# Patient Record
Sex: Male | Born: 1935 | Race: Black or African American | Hispanic: No | Marital: Married | Smoking: Former smoker
Health system: Southern US, Community
[De-identification: ages and names within clinical notes are randomized; demographics above are authoritative.]

## PROBLEM LIST (undated history)

## (undated) DIAGNOSIS — I4891 Unspecified atrial fibrillation: Principal | ICD-10-CM

## (undated) DIAGNOSIS — E876 Hypokalemia: Secondary | ICD-10-CM

## (undated) DIAGNOSIS — C320 Malignant neoplasm of glottis: Secondary | ICD-10-CM

## (undated) DIAGNOSIS — Z923 Personal history of irradiation: Secondary | ICD-10-CM

## (undated) DIAGNOSIS — I10 Essential (primary) hypertension: Secondary | ICD-10-CM

## (undated) DIAGNOSIS — I4510 Unspecified right bundle-branch block: Secondary | ICD-10-CM

## (undated) HISTORY — DX: Unspecified atrial fibrillation: I48.91

## (undated) HISTORY — DX: Unspecified right bundle-branch block: I45.10

## (undated) HISTORY — PX: HERNIA REPAIR: SHX51

---

## 2003-10-09 ENCOUNTER — Emergency Department (HOSPITAL_COMMUNITY): Admission: EM | Admit: 2003-10-09 | Discharge: 2003-10-09 | Payer: Self-pay | Admitting: Emergency Medicine

## 2012-06-20 ENCOUNTER — Other Ambulatory Visit: Payer: Self-pay | Admitting: Family Medicine

## 2012-06-20 DIAGNOSIS — Z136 Encounter for screening for cardiovascular disorders: Secondary | ICD-10-CM

## 2012-07-04 ENCOUNTER — Ambulatory Visit
Admission: RE | Admit: 2012-07-04 | Discharge: 2012-07-04 | Disposition: A | Discharge status: Home / Self Care | Payer: Medicare Other | Source: Ambulatory Visit | Attending: Family Medicine | Admitting: Family Medicine

## 2012-07-04 DIAGNOSIS — Z136 Encounter for screening for cardiovascular disorders: Secondary | ICD-10-CM

## 2013-06-07 ENCOUNTER — Encounter (HOSPITAL_BASED_OUTPATIENT_CLINIC_OR_DEPARTMENT_OTHER): Admission: RE | Payer: Self-pay | Source: Ambulatory Visit

## 2013-06-07 ENCOUNTER — Ambulatory Visit (HOSPITAL_BASED_OUTPATIENT_CLINIC_OR_DEPARTMENT_OTHER): Admission: RE | Admit: 2013-06-07 | Payer: Medicare Other | Source: Ambulatory Visit | Admitting: Otolaryngology

## 2013-06-07 SURGERY — MICROLARYNGOSCOPY
Anesthesia: General

## 2013-08-22 DIAGNOSIS — C320 Malignant neoplasm of glottis: Secondary | ICD-10-CM

## 2013-08-22 HISTORY — DX: Malignant neoplasm of glottis: C32.0

## 2013-08-28 ENCOUNTER — Other Ambulatory Visit: Payer: Self-pay | Admitting: Otolaryngology

## 2013-08-28 DIAGNOSIS — C801 Malignant (primary) neoplasm, unspecified: Secondary | ICD-10-CM

## 2013-09-02 ENCOUNTER — Ambulatory Visit
Admission: RE | Admit: 2013-09-02 | Discharge: 2013-09-02 | Disposition: A | Discharge status: Home / Self Care | Payer: Medicare Other | Source: Ambulatory Visit | Attending: Otolaryngology | Admitting: Otolaryngology

## 2013-09-02 DIAGNOSIS — C801 Malignant (primary) neoplasm, unspecified: Secondary | ICD-10-CM

## 2013-09-02 MED ORDER — IOHEXOL 300 MG/ML  SOLN
75.0000 mL | Freq: Once | INTRAMUSCULAR | Status: AC | PRN
  Administered 2013-09-02: 75 mL via INTRAVENOUS

## 2013-09-02 NOTE — Progress Notes (Signed)
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 male who  presented with chronic hoarseness, in August 2014, the day he swallowed a pill "the wrong way." He initially saw Dr. Janace Hoard, who recommended a biopsy, but the patient wanted a second opinion. A couple months later he saw Dr. Lucia Gaskins. Laryngoscopy by Dr. Lucia Gaskins revealed an abnormal growth on the left true vocal CORD with slight diminished mobility on the left vocal cord compared to the right. The right vocal cord was clear. The abnormalities were limited to the glottis. On neck exam he had a smooth nodule overlying the anterior laryngeal cartilage. Clinically Dr. 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 It is mobile and smooth.  Biopsies of Left Vocal cord byDr. Leonides Sake. Newman revealed: 08/22/13, = Squamous Cell Carcinoma with Focal Necrosis.  CT scan of the neck performed on 09/02/2013 was discussed today at tumor board. 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 He has small scattered lymph nodes in the neck, some of which are rounded, but they are not highly suspicious for metastatic disease.  Denies dysphagia.   denies weight loss. Denies pain. Stopped smoking 15-20 yrs ago.Stopped chewing tobacco about 6 months ago. 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    PAST SURGICAL HISTORY:History reviewed. No pertinent past surgical  history.  FAMILY HISTORY: family history includes Cancer in his brother and mother.  SOCIAL HISTORY:  reports that he quit smoking about 14 years ago. He quit smokeless tobacco use today. His smokeless tobacco use included Chew. He reports that he drinks alcohol.  ALLERGIES: Review of patient's allergies indicates no known allergies.  MEDICATIONS:  Current Outpatient Prescriptions  Medication Sig Dispense Refill  . guaiFENesin (MUCINEX) 600 MG 12 hr tablet Take 600 mg by mouth as needed.      . Pseudoephedrine HCl (SINUS & ALLERGY 12 HOUR PO) Take 1 tablet by mouth as needed.      . tamsulosin (FLOMAX) 0.4 MG CAPS capsule Take 0.4 mg by mouth daily after breakfast.      . telmisartan-hydrochlorothiazide (MICARDIS HCT) 80-25 MG per tablet Take 1 tablet by mouth daily.       No current facility-administered medications for this encounter.    REVIEW OF SYSTEMS:  Notable for that above.   PHYSICAL EXAM:  vitals were not taken for this visit. weight 221 pounds. Height 5 foot 11 inches. Blood pressure 147/88. Pulse 96. Respiratory rate 20. Temperature 98.1 degrees. Pulse ox 99%  General: Alert and oriented, in no acute distress. He is very hoarse HEENT: Head is normocephalic. Poor dentition. Upper dentures. Moist mucous membranes in his mouth.. Oropharynx is clear. Neck: Mobile, soft growth anterior to laryngeal cartilage consistent with cyst, no palpable cervical or supraclavicular lymphadenopathy. Heart: Regular in rate and rhythm with no murmurs, rubs, or gallops. Chest: Clear to auscultation bilaterally, with no rhonchi, wheezes, or rales. Abdomen: Soft, nontender, nondistended, with no rigidity or  guarding. Extremities: No cyanosis or edema. Lymphatics: No concerning lymphadenopathy. Skin: No concerning lesions. Musculoskeletal: symmetric strength and muscle tone throughout. Neurologic: Cranial nerves II through XII are grossly intact. No obvious focalities. Speech is fluent.  Coordination is intact. Psychiatric: Judgment and insight are intact. Affect is appropriate.  Laryngoscopic exam: Anesthetic was administered in the patient's nares. Exam revealed a small deficit in the anterior left true vocal cord. This is presumably where the biopsy was performed. No involvement of the anterior commissure or contralateral vocal cord. Currently no obvious impaired mobility on exam of the vocal cords.  ECOG =0  0 - Asymptomatic (Fully active, able to carry on all predisease activities without restriction)  1 - Symptomatic but completely ambulatory (Restricted in physically strenuous activity but ambulatory and able to carry out work of a light or sedentary nature. For example, light housework, office work)  2 - Symptomatic, <50% in bed during the day (Ambulatory and capable of all self care but unable to carry out any work activities. Up and about more than 50% of waking hours)  3 - Symptomatic, >50% in bed, but not bedbound (Capable of only limited self-care, confined to bed or chair 50% or more of waking hours)  4 - Bedbound (Completely disabled. Cannot carry on any self-care. Totally confined to bed or chair)  5 - Death   Eustace Pen MM, Creech RH, Tormey DC, et al. [phone]). "Toxicity and response criteria of the [facility]". [place] Oncol. 5 (6): 649-55   LABORATORY DATA:  No results found for this basename: WBC,  HGB,  HCT,  MCV,  PLT   CMP  No results found for this basename: na,  k,  cl,  co2,  glucose,  bun,  creatinine,  calcium,  prot,  albumin,  ast,  alt,  alkphos,  bilitot,  gfrnonaa,  gfraa        RADIOGRAPHY:CT neck on 09-02-13 showed 1. Asymmetry of the left vocal cord concerning for tumor. There is  no significant infraglottic extension.  2. Question extension through thyroid cartilage with a anterior soft  tissue nodule measuring 2.1 x 2.7 x 1.4 cm. This could represent a  thyroglossal duct cyst.  3. Rounded left level 3 lymph  nodes are not pathologically enlarged  but do raise some concern for metastatic disease.  4. Multiple metallic BBs throughout the left neck.  5. Pleural calcifications compatible with prior asbestos exposure.  There is no evidence for asbestosis.  IMPRESSION/PLAN: This patient has what appears to be clinical T2N0 glottic cancer (based on some compromise of the mobility of the left vocal cord on otolaryngology's laryngoscopic exam before biopsy)  For his early stage glottic cancer, I recommend external beam radiotherapy to a dose of 65.25 Gray in 29 fractions at 2.25 Pearline Cables per fraction. I would treat with opposed lateral beams, focusing on the larynx with margin.  We discussed the risks benefits and side effects of laryngeal radiotherapy which include but are not necessarily limited to fatigue, skin irritation and esophagitis in the acute setting. He understands that late effects may include rare damage to the internal tissues including the cartilage, larynx, and esophagus. We discussed risk of hypothyroidism. He is enthusiastic about proceeding. A consent form has been signed and placed in his chart. We will simulate him on February 6, anticipating starting treatment about a week thereafter.  As described above, based on discussion with radiology, there is a very low suspicion of any positive lymph nodes in his  neck and no further workup will be necessary for staging. However, Dr. Lucia Gaskins will aspirate the presumed thyro glossal duct cyst in his office later this morning to confirm that it is benign.  I spent 45 minutes  face to face with the patient and more than 50% of that time was spent in counseling and/or coordination of care.    __________________________________________   Eppie Gibson, MD

## 2013-09-03 ENCOUNTER — Telehealth: Payer: Self-pay | Admitting: Family Medicine

## 2013-09-03 ENCOUNTER — Encounter: Payer: Self-pay | Admitting: Radiation Oncology

## 2013-09-03 NOTE — Telephone Encounter (Signed)
Called pt at home to introduce myself as the nurse navigator that works with [name].  I indicated that I would be joining him tomorrow during his appt and would tell him more about my role as a member of his Care Team when we meet.  He confirmed he knows the location of [place].  I encouraged him to arrive at 8:15 for his 8:30 Nurse Eval.  He indicated understanding.  Initiating navigation as L1 (new) patient with this encounter.  Gayleen Orem, RN, BSN, [facility] Head & Neck Oncology Navigator [phone]

## 2013-09-03 NOTE — Progress Notes (Signed)
Head and Neck Cancer Location of Tumor / Histology: Squamous Cell carcinoma of the Left Vocal Cord    Patient presented with chronic hoarseness, in August 014,  the day  he swallowed a pill "the wrong way."  He was seen by an ENT a "few months" prior to being see by Dr. Janace Hoard, but wanted a second opinion.  He has a a "scratchy voice".  Biopsies of Left Vocal cord (if applicable) revealed: 3/71/06,  = Squamous Cell Carcinoma with Focal Necrosis, Dr. Leonides Sake. Newman  Nutrition Status:  Weight changes:   Swallowing status:   Plans, if any, for PEG tube:   Tobacco/Marijuana/Snuff/ETOH use: Stopped smoking 15-20 years ago  Past/Anticipated interventions by otolaryngology, if any:   Past/Anticipated interventions by medical oncology, if any:   Referrals yet, to any of the following?  Social Work?   Dentistry?   Swallowing therapy?   Nutrition?   Med/Onc?   PEG placement?   SAFETY ISSUES:  Prior radiation?   Pacemaker/ICD?   Is the patient on methotrexate?   Current Complaints / other details:

## 2013-09-04 ENCOUNTER — Ambulatory Visit
Admission: RE | Admit: 2013-09-04 | Discharge: 2013-09-04 | Disposition: A | Discharge status: Home / Self Care | Payer: Medicare Other | Source: Ambulatory Visit | Attending: Radiation Oncology | Admitting: Radiation Oncology

## 2013-09-04 ENCOUNTER — Encounter: Payer: Self-pay | Admitting: Radiation Oncology

## 2013-09-04 ENCOUNTER — Encounter: Payer: Self-pay | Admitting: *Deleted

## 2013-09-04 ENCOUNTER — Other Ambulatory Visit (HOSPITAL_COMMUNITY)
Admission: RE | Admit: 2013-09-04 | Discharge: 2013-09-04 | Disposition: A | Discharge status: Home / Self Care | Payer: Medicare Other | Source: Ambulatory Visit | Attending: Otolaryngology | Admitting: Otolaryngology

## 2013-09-04 VITALS — BP 147/88 | HR 96 | Temp 98.1°F | Resp 20 | Ht 71.0 in | Wt 221.1 lb

## 2013-09-04 DIAGNOSIS — R221 Localized swelling, mass and lump, neck: Principal | ICD-10-CM

## 2013-09-04 DIAGNOSIS — Z87891 Personal history of nicotine dependence: Secondary | ICD-10-CM | POA: Insufficient documentation

## 2013-09-04 DIAGNOSIS — C320 Malignant neoplasm of glottis: Secondary | ICD-10-CM | POA: Insufficient documentation

## 2013-09-04 DIAGNOSIS — Z79899 Other long term (current) drug therapy: Secondary | ICD-10-CM | POA: Insufficient documentation

## 2013-09-04 DIAGNOSIS — R220 Localized swelling, mass and lump, head: Secondary | ICD-10-CM | POA: Insufficient documentation

## 2013-09-04 DIAGNOSIS — I10 Essential (primary) hypertension: Secondary | ICD-10-CM | POA: Insufficient documentation

## 2013-09-04 DIAGNOSIS — C801 Malignant (primary) neoplasm, unspecified: Secondary | ICD-10-CM | POA: Insufficient documentation

## 2013-09-04 DIAGNOSIS — R091 Pleurisy: Secondary | ICD-10-CM | POA: Insufficient documentation

## 2013-09-04 DIAGNOSIS — R490 Dysphonia: Secondary | ICD-10-CM | POA: Insufficient documentation

## 2013-09-04 HISTORY — DX: Essential (primary) hypertension: I10

## 2013-09-04 HISTORY — DX: Malignant neoplasm of glottis: C32.0

## 2013-09-04 MED ORDER — LARYNGOSCOPY SOLUTION RAD-ONC
15.0000 mL | Freq: Once | TOPICAL | Status: AC
  Administered 2013-09-04: 15 mL via TOPICAL
  Filled 2013-09-04: qty 15

## 2013-09-04 NOTE — Progress Notes (Signed)
Please see the Nurse Progress Note in the MD Initial Consult Encounter for this patient. 

## 2013-09-04 NOTE — Progress Notes (Signed)
  Radiation Oncology         [phone] ________________________________  Name: Derrick Ayala MRN: [id number]  Date: 09/06/2013  DOB: [birth year]  SIMULATION AND TREATMENT PLANNING NOTE  outpatient  DIAGNOSIS:  Glottic Cancer, Stage T2N0M0  NARRATIVE:  The patient was brought to the [place] suite.  Identity was confirmed.  All relevant records and images related to the planned course of therapy were reviewed.  The patient freely provided informed written consent to proceed with treatment after reviewing the details related to the planned course of therapy. The consent form was witnessed and verified by the simulation staff.    Then, the patient was set-up in a stable reproducible  prone position for radiation therapy.  CT images were obtained.  Surface markings were placed.  The CT images were loaded into the planning software.    TREATMENT PLANNING NOTE: Treatment planning then occurred.  The radiation prescription was entered and confirmed.    A total of 3 medically necessary complex treatment devices were fabricated and supervised by me - aquaplast mask and 2 wedges for the opposed lateral fields.. I have requested : Isodose Plan.   The patient will receive 65.25 Gy in 29 fractions.   -----------------------------------  Eppie Gibson, MD

## 2013-09-04 NOTE — Progress Notes (Signed)
Met with patient during initial consult with [name].  Further introduced myself as his Navigator, explained my role as a member of his Care Team, showed him standard mask, encouraged him to call me with questions and concerns as he begins procedures and treatments.  He verbalized understanding. Showed him SIM and LINAC 1/2, explained arrival procedure when he begins treatments.  He verbalized understanding.  Initiating navigation as L1 patient (new) with this encounter.  Gayleen Orem, RN, BSN, [facility] Head & Neck Oncology Navigator [phone]

## 2013-09-04 NOTE — Progress Notes (Addendum)
Editor: Rebecca Eaton, RN (Registered Nurse)      Head and Neck Cancer Location of Tumor / Histology: Squamous Cell carcinoma of the Left Vocal Cord   Patient presented with chronic hoarseness, in August 014, the day he swallowed a pill "the wrong way." He was seen by an ENT a "few months" prior to being see by Dr. Janace Hoard, but wanted a second opinion. He has a a "scratchy voice".  Biopsies of Left Vocal cord (if applicable) revealed: 3/50/09, = Squamous Cell Carcinoma with Focal Necrosis, Dr. Leonides Sake. Newman   Nutrition Status:  Weight changes: says has gained some weight back recently  Swallowing status:  No difficulty  Plans, if any, for PEG tube:  Not as yet  Tobacco/Marijuana/Snuff/ETOH use: Stopped smoking 15-20 years ago   Past/Anticipated interventions by otolaryngology, if any:  Dr. Melony Overly   Past/Anticipated interventions by medical oncology, if any: No  Referrals yet, to any of the following?  Social Work? NO  Dentistry? No Swallowing therapy? No Nutrition? No Med/Onc? No PEG placement?  SAFETY ISSUES:  Prior radiation? No Pacemaker/ICD? No Is the patient on methotrexate? No  Current Complaints / other details: Married, 1 daughter , no c/o pain, or discomfort just hoarseness, lHx left inguinal hernia repair, Mothr deceased in her 38's unknown cancer, 1 brother deceased in his 6-0's of lung cancer

## 2013-09-06 ENCOUNTER — Ambulatory Visit
Admission: RE | Admit: 2013-09-06 | Discharge: 2013-09-06 | Disposition: A | Discharge status: Home / Self Care | Payer: Medicare Other | Source: Ambulatory Visit | Attending: Radiation Oncology | Admitting: Radiation Oncology

## 2013-09-06 ENCOUNTER — Encounter: Payer: Self-pay | Admitting: *Deleted

## 2013-09-06 DIAGNOSIS — Y842 Radiological procedure and radiotherapy as the cause of abnormal reaction of the patient, or of later complication, without mention of misadventure at the time of the procedure: Secondary | ICD-10-CM | POA: Insufficient documentation

## 2013-09-06 DIAGNOSIS — R1310 Dysphagia, unspecified: Secondary | ICD-10-CM | POA: Insufficient documentation

## 2013-09-06 DIAGNOSIS — C320 Malignant neoplasm of glottis: Secondary | ICD-10-CM | POA: Insufficient documentation

## 2013-09-06 DIAGNOSIS — L988 Other specified disorders of the skin and subcutaneous tissue: Secondary | ICD-10-CM | POA: Insufficient documentation

## 2013-09-06 DIAGNOSIS — Z510 Encounter for antineoplastic radiation therapy: Secondary | ICD-10-CM | POA: Insufficient documentation

## 2013-09-06 DIAGNOSIS — E876 Hypokalemia: Secondary | ICD-10-CM | POA: Insufficient documentation

## 2013-09-06 NOTE — Progress Notes (Signed)
Met with patient during his SIM to provide support and encouragement.  Following procedure, re-familiarized him with LINAC 1 area where he will be treated, introduced him to RTTs, re-explained process for check-in and arriving to RT for tmt.  He verbalized understanding.  Continuing to navigate as L1 (new) patient.  Gayleen Orem, RN, BSN, [facility] Head & Neck Oncology Navigator [phone]

## 2013-09-13 ENCOUNTER — Ambulatory Visit
Admission: RE | Admit: 2013-09-13 | Discharge: 2013-09-13 | Disposition: A | Discharge status: Home / Self Care | Payer: Medicare Other | Source: Ambulatory Visit | Attending: Radiation Oncology | Admitting: Radiation Oncology

## 2013-09-13 ENCOUNTER — Telehealth: Payer: Self-pay | Admitting: *Deleted

## 2013-09-13 ENCOUNTER — Inpatient Hospital Stay: Admission: RE | Admit: 2013-09-13 | Payer: Medicare Other | Source: Ambulatory Visit | Admitting: Radiation Oncology

## 2013-09-13 DIAGNOSIS — C320 Malignant neoplasm of glottis: Secondary | ICD-10-CM

## 2013-09-13 NOTE — Telephone Encounter (Signed)
Called patient re: today's 5:30 Port appt.  Offered him clarification that today was for another scan, his actual 1st tmt will be Monday.  He verbalized understanding.  He stated that he did not know results of FNA he had by Dr. Lucia Gaskins recently, expressed anticipation that [name] could provide information today.  I told him I would make [name] aware.  Continuing navigation as L1 patient (new patient).  Gayleen Orem, RN, BSN, [facility] Head & Neck Oncology Navigator [phone]

## 2013-09-13 NOTE — Progress Notes (Signed)
Simulation Verification Note Outpatient  The patient was brought to the treatment unit and placed in the planned treatment position. The clinical setup was verified. Then port films were obtained and uploaded to the radiation oncology medical record software.  The treatment beams were carefully compared against the planned radiation fields. The position location and shape of the radiation fields was reviewed. They targeted volume of tissue appears to be appropriately covered by the radiation beams. Organs at risk appear to be excluded as planned.  Based on my personal review, I approved the simulation verification. The patient's treatment will proceed as planned.  -----------------------------------  Sherron Mapp, MD   

## 2013-09-16 ENCOUNTER — Ambulatory Visit
Admission: RE | Admit: 2013-09-16 | Discharge: 2013-09-16 | Disposition: A | Discharge status: Home / Self Care | Payer: Medicare Other | Source: Ambulatory Visit | Attending: Radiation Oncology | Admitting: Radiation Oncology

## 2013-09-16 ENCOUNTER — Encounter: Payer: Self-pay | Admitting: Radiation Oncology

## 2013-09-16 VITALS — BP 139/80 | HR 91 | Temp 98.7°F | Resp 20 | Wt 225.0 lb

## 2013-09-16 DIAGNOSIS — C320 Malignant neoplasm of glottis: Secondary | ICD-10-CM

## 2013-09-16 MED ORDER — BIAFINE EX EMUL
CUTANEOUS | Status: DC | PRN
  Administered 2013-09-16: 09:00:00 via TOPICAL

## 2013-09-16 NOTE — Progress Notes (Signed)
Patient education don, rad book, biafine given, discusses pain, fatigue,skin irritation,throat changes, diet, loss tase, earaches, increas protein high calories, see MD weekly and prn, gave Charna Busman, RN business card to pateint, tach back given, hoarseness still, no c/o  9:09 AM

## 2013-09-16 NOTE — Progress Notes (Signed)
   Weekly Management Note:  outpatient Current Dose:  2.25 Gy  Projected Dose: 65.25 Gy   Narrative:  The patient presents for routine under treatment assessment.  CBCT/MVCT images/Port film x-rays were reviewed.  The chart was checked. Doing well.  Physical Findings:  weight is 225 lb (102.059 kg). His oral temperature is 98.7 F (37.1 C). His blood pressure is 139/80 and his pulse is 91. His respiration is 20 and oxygen saturation is 100%.  NAD  Impression:  The patient is tolerating radiotherapy.  Plan:  Continue radiotherapy as planned. I informed him of the benign results from his neck aspiration.  ________________________________   Eppie Gibson, M.D.

## 2013-09-17 ENCOUNTER — Ambulatory Visit
Admission: RE | Admit: 2013-09-17 | Discharge: 2013-09-17 | Disposition: A | Discharge status: Home / Self Care | Payer: Medicare Other | Source: Ambulatory Visit | Attending: Radiation Oncology | Admitting: Radiation Oncology

## 2013-09-18 ENCOUNTER — Ambulatory Visit
Admission: RE | Admit: 2013-09-18 | Discharge: 2013-09-18 | Disposition: A | Discharge status: Home / Self Care | Payer: Medicare Other | Source: Ambulatory Visit | Attending: Radiation Oncology | Admitting: Radiation Oncology

## 2013-09-18 ENCOUNTER — Encounter: Payer: Self-pay | Admitting: *Deleted

## 2013-09-18 NOTE — [facility] Oncology Navigator Note (Signed)
Met with patient prior to scheduled RT.  He stated that tmts are going well, did not express any concerns or needs.  I reminded him to call if that changes; he verbalized understanding.  Continuing navigation as L1 patient (new patient).  Gayleen Orem, RN, BSN, [facility] Head & Neck Oncology Navigator [phone]

## 2013-09-19 ENCOUNTER — Ambulatory Visit
Admission: RE | Admit: 2013-09-19 | Discharge: 2013-09-19 | Disposition: A | Discharge status: Home / Self Care | Payer: Medicare Other | Source: Ambulatory Visit | Attending: Radiation Oncology | Admitting: Radiation Oncology

## 2013-09-20 ENCOUNTER — Ambulatory Visit
Admission: RE | Admit: 2013-09-20 | Discharge: 2013-09-20 | Disposition: A | Discharge status: Home / Self Care | Payer: Medicare Other | Source: Ambulatory Visit | Attending: Radiation Oncology | Admitting: Radiation Oncology

## 2013-09-23 ENCOUNTER — Encounter: Payer: Self-pay | Admitting: *Deleted

## 2013-09-23 ENCOUNTER — Ambulatory Visit
Admission: RE | Admit: 2013-09-23 | Discharge: 2013-09-23 | Disposition: A | Discharge status: Home / Self Care | Payer: Medicare Other | Source: Ambulatory Visit | Attending: Radiation Oncology | Admitting: Radiation Oncology

## 2013-09-23 ENCOUNTER — Encounter: Payer: Self-pay | Admitting: Radiation Oncology

## 2013-09-23 VITALS — BP 152/78 | HR 93 | Temp 98.7°F | Wt 224.1 lb

## 2013-09-23 DIAGNOSIS — C320 Malignant neoplasm of glottis: Secondary | ICD-10-CM

## 2013-09-23 MED ORDER — MAGIC MOUTHWASH W/LIDOCAINE: ORAL | Status: DC

## 2013-09-23 NOTE — Progress Notes (Signed)
   Weekly Management Note:  outpatient Current Dose:  13.5 Gy  Projected Dose: 65.25 Gy   Narrative:  The patient presents for routine under treatment assessment.  CBCT/MVCT images/Port film x-rays were reviewed.  The chart was checked. No complaints  Physical Findings:  weight is 224 lb 1.6 oz (101.651 kg). His temperature is 98.7 F (37.1 C). His blood pressure is 152/78 and his pulse is 93. His oxygen saturation is 97%.  No skin irritation on neck  Impression:  The patient is tolerating radiotherapy.  Plan:  Continue radiotherapy as planned. Rx for MMW given in case odynophagia develops this week.  ________________________________   Eppie Gibson, M.D.

## 2013-09-23 NOTE — Progress Notes (Signed)
Derrick Ayala has received 6 fractions to the larynx.  He is hoarse voice and denies any sore throat or difficulty swallowing.

## 2013-09-24 ENCOUNTER — Ambulatory Visit
Admission: RE | Admit: 2013-09-24 | Discharge: 2013-09-24 | Disposition: A | Discharge status: Home / Self Care | Payer: Medicare Other | Source: Ambulatory Visit | Attending: Radiation Oncology | Admitting: Radiation Oncology

## 2013-09-25 ENCOUNTER — Ambulatory Visit
Admission: RE | Admit: 2013-09-25 | Discharge: 2013-09-25 | Disposition: A | Discharge status: Home / Self Care | Payer: Medicare Other | Source: Ambulatory Visit | Attending: Radiation Oncology | Admitting: Radiation Oncology

## 2013-09-26 ENCOUNTER — Ambulatory Visit
Admission: RE | Admit: 2013-09-26 | Discharge: 2013-09-26 | Disposition: A | Discharge status: Home / Self Care | Payer: Medicare Other | Source: Ambulatory Visit | Attending: Radiation Oncology | Admitting: Radiation Oncology

## 2013-09-27 ENCOUNTER — Ambulatory Visit
Admission: RE | Admit: 2013-09-27 | Discharge: 2013-09-27 | Disposition: A | Discharge status: Home / Self Care | Payer: Medicare Other | Source: Ambulatory Visit | Attending: Radiation Oncology | Admitting: Radiation Oncology

## 2013-09-27 ENCOUNTER — Encounter: Payer: Self-pay | Admitting: *Deleted

## 2013-09-27 NOTE — Progress Notes (Signed)
Met with patient prior to his scheduled RT.  He stated that he thinks his voice is improving somewhat; is beginning to experience throat discomfort with swallowing.  He reports that he is able to continue eating and drinking per normal; has not needed MMW w/ Lidocaine yet, still has Rx that and will have it filled when this changes.  He stated he continues to apply Biafine BID with the first application after his morning RT.  He denied any concerns/needs.  Continuing navigation as L1 patient (new patient).  Gayleen Orem, RN, BSN, [facility] Head & Neck Oncology Navigator [phone]

## 2013-09-30 ENCOUNTER — Ambulatory Visit
Admission: RE | Admit: 2013-09-30 | Discharge: 2013-09-30 | Disposition: A | Discharge status: Home / Self Care | Payer: Medicare Other | Source: Ambulatory Visit | Attending: Radiation Oncology | Admitting: Radiation Oncology

## 2013-09-30 ENCOUNTER — Telehealth: Payer: Self-pay | Admitting: *Deleted

## 2013-09-30 ENCOUNTER — Inpatient Hospital Stay
Admission: RE | Admit: 2013-09-30 | Discharge: 2013-09-30 | Disposition: A | Discharge status: Home / Self Care | Payer: Medicare Other | Source: Ambulatory Visit | Attending: Radiation Oncology | Admitting: Radiation Oncology

## 2013-09-30 ENCOUNTER — Encounter: Payer: Self-pay | Admitting: *Deleted

## 2013-09-30 ENCOUNTER — Encounter: Payer: Self-pay | Admitting: Radiation Oncology

## 2013-09-30 VITALS — BP 136/87 | HR 92 | Temp 98.3°F | Ht 71.0 in | Wt 223.0 lb

## 2013-09-30 DIAGNOSIS — C320 Malignant neoplasm of glottis: Secondary | ICD-10-CM

## 2013-09-30 NOTE — Progress Notes (Addendum)
Derrick Ayala has received 11 fractions to his throat for glottic cancer.  He reports sore throat and is now eating soft foods and soups, but is using a food processor to grind up chicken.  Oral cavity clear and moist, but he notes thickened saliva.  He has a script for [employer] for his sore throat, but has not started using it, so encouraged to start today.  Also encouraged gargling with baking side/salt mixture which is in his radiation therapy and You booklet.  Skin on neck soft and intact.  Asked Mr. Katt to bring the dates of his flu vaccine and Pneumonia vaccine on next visit.

## 2013-09-30 NOTE — Progress Notes (Signed)
   Weekly Management Note:  outpatient Current Dose:  24.75 Gy  Projected Dose: 65.25 Gy   Narrative:  The patient presents for routine under treatment assessment.  CBCT/MVCT images/Port film x-rays were reviewed.  The chart was checked. Doing well. Slight sore throat now, and thickened saliva.  Physical Findings:  height is 5\' 11"  (1.803 m) and weight is 223 lb (101.152 kg). His temperature is 98.3 F (36.8 C). His blood pressure is 136/87 and his pulse is 92.  NAD, mild skin hyperpigmentation in RT fields, oropharynx clear.  Impression:  The patient is tolerating radiotherapy.  Plan:  Continue radiotherapy as planned. Will filll MMW Rx today. Instructed on how to use this.  ________________________________   Eppie Gibson, M.D.

## 2013-10-01 ENCOUNTER — Ambulatory Visit
Admission: RE | Admit: 2013-10-01 | Discharge: 2013-10-01 | Disposition: A | Discharge status: Home / Self Care | Payer: Medicare Other | Source: Ambulatory Visit | Attending: Radiation Oncology | Admitting: Radiation Oncology

## 2013-10-02 ENCOUNTER — Ambulatory Visit
Admission: RE | Admit: 2013-10-02 | Discharge: 2013-10-02 | Disposition: A | Discharge status: Home / Self Care | Payer: Medicare Other | Source: Ambulatory Visit | Attending: Radiation Oncology | Admitting: Radiation Oncology

## 2013-10-03 ENCOUNTER — Ambulatory Visit
Admission: RE | Admit: 2013-10-03 | Discharge: 2013-10-03 | Disposition: A | Discharge status: Home / Self Care | Payer: Medicare Other | Source: Ambulatory Visit | Attending: Radiation Oncology | Admitting: Radiation Oncology

## 2013-10-03 NOTE — Telephone Encounter (Signed)
Received call from patient wife who expressed concern about the throat pain her husband is experiencing.   I educated her re: cause and effect, and use of MMW to provide relief.  She verbalized understanding.  Gayleen Orem, RN, BSN, [facility] Head & Neck Oncology Navigator [phone]

## 2013-10-03 NOTE — Progress Notes (Addendum)
Met with patient during scheduled UT appt with [name] to provide support and care continuity.  Patient did not express any needs/concerns.  Gayleen Orem, RN, BSN, [facility] Head & Neck Oncology Navigator [phone]

## 2013-10-03 NOTE — Progress Notes (Signed)
Met with patient during weekly UT with [name].  Patient stated that his wife would be calling me with her concern about his increasing throat soreness; I explained that I would educate her re: cause and effect, and use of MMW to provide relief..   Patient stated he would have his MMW Rx filled at [place] after this appt.  Gayleen Orem, RN, BSN, [facility] Head & Neck Oncology Navigator [phone]

## 2013-10-04 ENCOUNTER — Ambulatory Visit
Admission: RE | Admit: 2013-10-04 | Discharge: 2013-10-04 | Disposition: A | Discharge status: Home / Self Care | Payer: Medicare Other | Source: Ambulatory Visit | Attending: Radiation Oncology | Admitting: Radiation Oncology

## 2013-10-07 ENCOUNTER — Encounter: Payer: Self-pay | Admitting: *Deleted

## 2013-10-07 ENCOUNTER — Ambulatory Visit
Admission: RE | Admit: 2013-10-07 | Discharge: 2013-10-07 | Disposition: A | Discharge status: Home / Self Care | Payer: Medicare Other | Source: Ambulatory Visit | Attending: Radiation Oncology | Admitting: Radiation Oncology

## 2013-10-07 ENCOUNTER — Encounter: Payer: Self-pay | Admitting: Radiation Oncology

## 2013-10-07 ENCOUNTER — Other Ambulatory Visit (HOSPITAL_COMMUNITY): Payer: Self-pay | Admitting: Radiation Oncology

## 2013-10-07 VITALS — BP 120/77 | HR 94 | Temp 98.7°F | Ht 71.0 in | Wt 207.6 lb

## 2013-10-07 DIAGNOSIS — R1310 Dysphagia, unspecified: Secondary | ICD-10-CM

## 2013-10-07 DIAGNOSIS — C320 Malignant neoplasm of glottis: Secondary | ICD-10-CM

## 2013-10-07 MED ORDER — BIAFINE EX EMUL
CUTANEOUS | Status: DC | PRN
  Administered 2013-10-07: 10:00:00 via TOPICAL

## 2013-10-07 NOTE — Progress Notes (Signed)
Received call from patient's wife who expressed concern re: husband's throat soreness over the weekend and resulting swallowing difficulty.  Met with patient during UT appt with [name].  Pt stated that he has not been able to eat anything for the past several days though has been able to take sips of liquid, reported sore throat whenever he swallowed.  He did not express distress.  Apparent 17# weight loss since last UT.   He stated he has been coughing up large amounts of phlem and during encounter did same; phlem copious, yellowish, no signs of blood.  Referrals made for nutritional and swallowing consults, the former scheduled for tomorrow @ 9:00 before RT.  Patient guided to pick-up [employer] and to use until further direction provided by nutritionist.  With patient's knowledge, I called his wife after the appt to make her aware of appt outcome.  Continuing navigation as L2 patient (treatments established).  Gayleen Orem, RN, BSN, [facility] Head & Neck Oncology Navigator [phone]

## 2013-10-07 NOTE — Progress Notes (Signed)
   Weekly Management Note:  Outpatient Current Dose:  36 Gy  Projected Dose: 65.25 Gy   Narrative:  The patient presents for routine under treatment assessment.  CBCT/MVCT images/Port film x-rays were reviewed.  The chart was checked. Has signficant dysphagia with solids. Soreness is mild. Using MMW.  Lots of mucous in throat.  Can drink liquids. Lost 13 lb/1 week. Denies nausea or vomiting or dizziness. Urinating as usual.  Physical Findings:  height is 5\' 11"  (1.803 m) and weight is 207 lb 9.6 oz (94.167 kg). His temperature is 98.7 F (37.1 C). His blood pressure is 120/77 and his pulse is 94.  Oropharnyx clear. Skin - mild hyperpigmentation of anterior neck  Impression:  The patient is tolerating radiotherapy with dysphagia.  Plan:  Continue radiotherapy as planned.  1) urgent Nutrition consult tomorrow. Until then, recommended >2000 calories daily in the form of nutritional shakes and full fat cream based soups, plenty of fluids  2) urgent swallowing therapy consult ordered with MBSS.    3) patient does not think mucous is affecting swallowing, declined scopolamine patch  4) re check vitals on Wednesday.  ________________________________   Eppie Gibson, M.D.

## 2013-10-07 NOTE — Progress Notes (Signed)
Derrick Ayala has received 16 fractions to his neck for glottic cancer.  He c/o increasing pain upon swallowing as a level 9/10 today. His voice is raspier than on last exam.  He has lost 17 lbs.

## 2013-10-08 ENCOUNTER — Ambulatory Visit
Admission: RE | Admit: 2013-10-08 | Discharge: 2013-10-08 | Disposition: A | Discharge status: Home / Self Care | Payer: Medicare Other | Source: Ambulatory Visit | Attending: Radiation Oncology | Admitting: Radiation Oncology

## 2013-10-08 ENCOUNTER — Ambulatory Visit: Payer: Medicare Other | Admitting: Nutrition

## 2013-10-08 ENCOUNTER — Telehealth: Payer: Self-pay | Admitting: *Deleted

## 2013-10-08 NOTE — Telephone Encounter (Signed)
Called patient to inform of modified barium swallow for 10-10-13, spoke with patient's wife and she is aware of this test.

## 2013-10-08 NOTE — Progress Notes (Signed)
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  He is a patient of [name].  He is receiving radiation therapy.  Past medical history includes hypertension and tobacco usage.  Medications include Magic mouthwash.  Labs were reviewed.  Height: 5 feet 11 inches. Weight: 207.6 pounds on March 9. Usual body weight: 215-220 pounds. BMI: 28.97.  Estimated nutrition needs: 2250-2450 calories, 115-125 g protein, 2.3 L fluid.  Patient reports his throat is sore and he has difficulty with acid containing foods.  He continues to have solid food dysphasia.  He is tolerating thin liquids.  He no longer tolerates pured foods.  Patient also reports increased mucus production.  He states that since his radiation dosages increased his symptoms have worsened.  He is drinking Ensure Plus and was able to drink 2-3 yesterday.  Nutrition diagnosis: Unintended weight loss related to glottis cancer and associated treatments as evidenced by approximately 4% weight loss from usual body weight.  Intervention: I educated patient to consider trying to continue to pure food items and then add liquids to make them thinner for improved tolerance.  Educated patient if he is unable to consume any foods by mouth he would need to increase Ensure Plus or boost + to 7 bottles daily.  I provided patient with complementary samples of boost plus as well as fact sheets on foods that have extra calories and protein, and dysphasia strategies.  Questions were answered.  Teach back method used.  Monitoring, evaluation, goals: Patient will tolerate Ensure Plus or boost plus in addition to pured foods to minimize further weight loss.  Next visit: Tuesday, March 17, before radiation therapy.

## 2013-10-09 ENCOUNTER — Encounter: Payer: Self-pay | Admitting: Radiation Oncology

## 2013-10-09 ENCOUNTER — Ambulatory Visit
Admission: RE | Admit: 2013-10-09 | Discharge: 2013-10-09 | Disposition: A | Discharge status: Home / Self Care | Payer: Medicare Other | Source: Ambulatory Visit | Attending: Radiation Oncology | Admitting: Radiation Oncology

## 2013-10-09 VITALS — BP 95/60 | HR 95 | Resp 18 | Wt 206.5 lb

## 2013-10-09 DIAGNOSIS — C320 Malignant neoplasm of glottis: Secondary | ICD-10-CM

## 2013-10-09 NOTE — Progress Notes (Signed)
   Weekly Management Note:  Outpatient Current Dose:  40.5 Gy  Projected Dose: 65.25 Gy   Narrative:  The patient presents for routine under treatment assessment.  CBCT/MVCT images/Port film x-rays were reviewed.  The chart was checked. He met with Dory Peru yesterday and she recommends that he take an approximately 2400 calories a day. She is recommending ensure plus or boost, 7 bottles daily. They also talked about pureeing food with liquids as alternatives. He feels that he is doing a bit better. However, he reports that thin liquids are easier to swallow than the nutritional shakes. He feels that the mucus in his throat affects his ability to swallow but he is fairly adamant that he doesn't want to start scopolamine patches for the mucus. He did take his blood pressure medication this morning. He denies lightheadedness. He denies nausea or vomiting. He reports that he is taking in a lot of liquids including [employer] and milk.  Physical Findings:  weight is 206 lb 8 oz (93.668 kg). His blood pressure is 95/60 and his pulse is 95. His respiration is 18.  he does not demonstrate orthostatic hypotension upon standing and having his vitals taken.   Impression:  The patient is tolerating radiotherapy.  Plan:  Continue radiotherapy as planned.  Recommended continued by mouth intake, oral hydration. If he develops any symptoms of dehydration such as decreased urination, lightheadedness, he should let us know so that we can reevaluate him for possible IV fluids. I also told him to call his primary doctor to inform them of his latest blood pressure. For now I told him to hold his Micardis, but to make sure his primary doctor is aware of that and get guidance as to what blood pressure is needed to resume the medication.  ________________________________   Eppie Gibson, M.D.

## 2013-10-09 NOTE — Progress Notes (Signed)
Patient denies sores or ulcerations of the mouth. Voice is hoarse. Reports thick rope like salvia despite rinsing regularly. Reports he can only take in liquids by mouth. Reports getting in 4 cans per day of either ensure of boost but, understands 6 are recommended. Hyperpigmentation of anterior neck without desquamation noted. Reports using biafine as directed. Gurgling wet sounds audible with expiration. Reports occasional nausea and vomiting. Denies headache or dizziness. Weighed 223 on 3/2 but, weighs 206 today.

## 2013-10-10 ENCOUNTER — Ambulatory Visit
Admission: RE | Admit: 2013-10-10 | Discharge: 2013-10-10 | Disposition: A | Discharge status: Home / Self Care | Payer: Medicare Other | Source: Ambulatory Visit | Attending: Radiation Oncology | Admitting: Radiation Oncology

## 2013-10-10 ENCOUNTER — Ambulatory Visit (HOSPITAL_COMMUNITY): Admission: RE | Admit: 2013-10-10 | Payer: Medicare Other | Source: Ambulatory Visit

## 2013-10-10 ENCOUNTER — Ambulatory Visit (HOSPITAL_COMMUNITY)
Admission: RE | Admit: 2013-10-10 | Discharge: 2013-10-10 | Disposition: A | Discharge status: Home / Self Care | Payer: Medicare Other | Source: Ambulatory Visit | Attending: Radiation Oncology | Admitting: Radiation Oncology

## 2013-10-11 ENCOUNTER — Ambulatory Visit
Admission: RE | Admit: 2013-10-11 | Discharge: 2013-10-11 | Disposition: A | Discharge status: Home / Self Care | Payer: Medicare Other | Source: Ambulatory Visit | Attending: Radiation Oncology | Admitting: Radiation Oncology

## 2013-10-14 ENCOUNTER — Other Ambulatory Visit: Payer: Self-pay | Admitting: Radiation Oncology

## 2013-10-14 ENCOUNTER — Ambulatory Visit
Admission: RE | Admit: 2013-10-14 | Discharge: 2013-10-14 | Disposition: A | Discharge status: Home / Self Care | Payer: Medicare Other | Source: Ambulatory Visit | Attending: Radiation Oncology | Admitting: Radiation Oncology

## 2013-10-14 ENCOUNTER — Ambulatory Visit: Payer: Medicare Other

## 2013-10-14 ENCOUNTER — Other Ambulatory Visit: Payer: Self-pay | Admitting: Oncology

## 2013-10-14 ENCOUNTER — Telehealth: Payer: Self-pay | Admitting: *Deleted

## 2013-10-14 ENCOUNTER — Other Ambulatory Visit (HOSPITAL_COMMUNITY): Payer: Self-pay | Admitting: Radiation Oncology

## 2013-10-14 VITALS — BP 146/75 | HR 91 | Temp 98.5°F

## 2013-10-14 VITALS — BP 121/68 | HR 46 | Temp 98.5°F | Ht 71.0 in | Wt 203.7 lb

## 2013-10-14 DIAGNOSIS — C320 Malignant neoplasm of glottis: Secondary | ICD-10-CM

## 2013-10-14 DIAGNOSIS — R1310 Dysphagia, unspecified: Secondary | ICD-10-CM

## 2013-10-14 LAB — BASIC METABOLIC PANEL (CC13)
Anion Gap: 14 mEq/L — ABNORMAL HIGH (ref 3–11)
BUN: 50.7 mg/dL — AB (ref 7.0–26.0)
CHLORIDE: 92 meq/L — AB (ref 98–109)
CO2: 32 mEq/L — ABNORMAL HIGH (ref 22–29)
CREATININE: 2 mg/dL — AB (ref 0.7–1.3)
Calcium: 10.1 mg/dL (ref 8.4–10.4)
Glucose: 115 mg/dl (ref 70–140)
Potassium: 3.6 mEq/L (ref 3.5–5.1)
Sodium: 137 mEq/L (ref 136–145)

## 2013-10-14 LAB — CBC WITH DIFFERENTIAL/PLATELET
BASO%: 0.7 % (ref 0.0–2.0)
BASOS ABS: 0 10*3/uL (ref 0.0–0.1)
EOS%: 2.4 % (ref 0.0–7.0)
Eosinophils Absolute: 0.1 10*3/uL (ref 0.0–0.5)
HEMATOCRIT: 41.6 % (ref 38.4–49.9)
HEMOGLOBIN: 14 g/dL (ref 13.0–17.1)
LYMPH#: 0.7 10*3/uL — AB (ref 0.9–3.3)
LYMPH%: 14.8 % (ref 14.0–49.0)
MCH: 32.5 pg (ref 27.2–33.4)
MCHC: 33.6 g/dL (ref 32.0–36.0)
MCV: 96.9 fL (ref 79.3–98.0)
MONO#: 0.8 10*3/uL (ref 0.1–0.9)
MONO%: 17 % — AB (ref 0.0–14.0)
NEUT#: 3.2 10*3/uL (ref 1.5–6.5)
NEUT%: 65.1 % (ref 39.0–75.0)
PLATELETS: 179 10*3/uL (ref 140–400)
RBC: 4.3 10*6/uL (ref 4.20–5.82)
RDW: 12.5 % (ref 11.0–14.6)
WBC: 4.9 10*3/uL (ref 4.0–10.3)

## 2013-10-14 MED ORDER — SODIUM CHLORIDE 0.9 % IV SOLN
250.0000 mL | INTRAVENOUS | Status: DC
  Administered 2013-10-14: 16:00:00 via INTRAVENOUS

## 2013-10-14 MED ORDER — DOCUSATE SODIUM 100 MG PO CAPS: 100.0000 mg | ORAL_CAPSULE | Freq: Two times a day (BID) | ORAL | Status: DC

## 2013-10-14 MED ORDER — HYDROCODONE-ACETAMINOPHEN 7.5-325 MG/15ML PO SOLN: 10.0000 mL | ORAL | Status: DC | PRN

## 2013-10-14 NOTE — Telephone Encounter (Signed)
CALLED PATIENT TO INFORM THAT HE NEEDS TO COME BACK IN FOR FLUIDS THIS AFTERNOON, SPOKE WITH PATIENT'S WIFE [name] AND THEY WILL BE HERE @ 3:45 PM TODAY.

## 2013-10-14 NOTE — Addendum Note (Signed)
Encounter addended by: Deirdre Evener, RN on: 10/14/2013  2:16 PM<BR>     Documentation filed: Demographics Visit

## 2013-10-14 NOTE — Progress Notes (Signed)
Derrick Ayala has received 21 fractions to his Glottic region.  He reports pain in his throat 9/10 presently with difficulty swallowing.  Using Magic Mouthwash QID.  He is requesting pain medication today.  His anterior neck demonstrates marked hyperpigmentation, but skin remains intact.  Has lost an additional 3 lbs since last week

## 2013-10-14 NOTE — Patient Instructions (Signed)
Dehydration, Adult Dehydration is when you lose more fluids from the body than you take in. Vital organs like the kidneys, brain, and heart cannot function without a proper amount of fluids and salt. Any loss of fluids from the body can cause dehydration.  CAUSES   Vomiting.  Diarrhea.  Excessive sweating.  Excessive urine output.  Fever. SYMPTOMS  Mild dehydration  Thirst.  Dry lips.  Slightly dry mouth. Moderate dehydration  Very dry mouth.  Sunken eyes.  Skin does not bounce back quickly when lightly pinched and released.  Dark urine and decreased urine production.  Decreased tear production.  Headache. Severe dehydration  Very dry mouth.  Extreme thirst.  Rapid, weak pulse (more than 100 beats per minute at rest).  Cold hands and feet.  Not able to sweat in spite of heat and temperature.  Rapid breathing.  Blue lips.  Confusion and lethargy.  Difficulty being awakened.  Minimal urine production.  No tears. DIAGNOSIS  Your caregiver will diagnose dehydration based on your symptoms and your exam. Blood and urine tests will help confirm the diagnosis. The diagnostic evaluation should also identify the cause of dehydration. TREATMENT  Treatment of mild or moderate dehydration can often be done at home by increasing the amount of fluids that you drink. It is best to drink small amounts of fluid more often. Drinking too much at one time can make vomiting worse. Refer to the home care instructions below. Severe dehydration needs to be treated at the hospital where you will probably be given intravenous (IV) fluids that contain water and electrolytes. HOME CARE INSTRUCTIONS   Ask your caregiver about specific rehydration instructions.  Drink enough fluids to keep your urine clear or pale yellow.  Drink small amounts frequently if you have nausea and vomiting.  Eat as you normally do.  Avoid:  Foods or drinks high in sugar.  Carbonated  drinks.  Juice.  Extremely hot or cold fluids.  Drinks with caffeine.  Fatty, greasy foods.  Alcohol.  Tobacco.  Overeating.  Gelatin desserts.  Wash your hands well to avoid spreading bacteria and viruses.  Only take over-the-counter or prescription medicines for pain, discomfort, or fever as directed by your caregiver.  Ask your caregiver if you should continue all prescribed and over-the-counter medicines.  Keep all follow-up appointments with your caregiver. SEEK MEDICAL CARE IF:  You have abdominal pain and it increases or stays in one area (localizes).  You have a rash, stiff neck, or severe headache.  You are irritable, sleepy, or difficult to awaken.  You are weak, dizzy, or extremely thirsty. SEEK IMMEDIATE MEDICAL CARE IF:   You are unable to keep fluids down or you get worse despite treatment.  You have frequent episodes of vomiting or diarrhea.  You have blood or green matter (bile) in your vomit.  You have blood in your stool or your stool looks black and tarry.  You have not urinated in 6 to 8 hours, or you have only urinated a small amount of very dark urine.  You have a fever.  You faint. MAKE SURE YOU:   Understand these instructions.  Will watch your condition.  Will get help right away if you are not doing well or get worse. Document Released: 07/18/2005 Document Revised: 10/10/2011 Document Reviewed: 03/07/2011 Exit[employer] Patient Information 2014 [employer].  

## 2013-10-14 NOTE — Progress Notes (Signed)
   Weekly Management Note:  Outpatient Current Dose:  47.25 Gy  Projected Dose: 65.25 Gy   Narrative:  The patient presents for routine under treatment assessment.  CBCT/MVCT images/Port film x-rays were reviewed.  The chart was checked. Throat pain 9/10 in severity.  Does not feel lightheaded.  Feels he is drinking a lot of fluid.  Holding micardis. Did not call his PCP as I recommended (although I did send PCP an inbox msg), and no-showed for swallowing study last week.   Physical Findings:  height is 5\' 11"  (1.803 m) and weight is 203 lb 11.2 oz (92.398 kg). His temperature is 98.5 F (36.9 C). His blood pressure is 121/68 and his pulse is 46.   BP 110/74, pulse  72 standing.  Oropharynx clear.  Anterior neck /skin hyperpigmented  Impression:  The patient is tolerating radiotherapy.  Plan:  Continue radiotherapy as planned.  1) Rx'd Hycet for pain, colace to prevent constipation.   2) Rescheduled swallowing studies (pt no showed last week) 3) STAT CBC, BMP - assess for dehydration 4) pt wishes to go home after labs.  Will call and discuss if IV fluids are needed.  ________________________________   Eppie Gibson, M.D.

## 2013-10-15 ENCOUNTER — Ambulatory Visit: Payer: Medicare Other

## 2013-10-15 ENCOUNTER — Ambulatory Visit
Admission: RE | Admit: 2013-10-15 | Discharge: 2013-10-15 | Disposition: A | Discharge status: Home / Self Care | Payer: Medicare Other | Source: Ambulatory Visit | Attending: Radiation Oncology | Admitting: Radiation Oncology

## 2013-10-15 ENCOUNTER — Other Ambulatory Visit: Payer: Self-pay | Admitting: Radiation Oncology

## 2013-10-15 ENCOUNTER — Ambulatory Visit: Payer: Medicare Other | Admitting: Nutrition

## 2013-10-15 VITALS — BP 119/67 | HR 87 | Temp 98.1°F | Resp 18

## 2013-10-15 DIAGNOSIS — C320 Malignant neoplasm of glottis: Secondary | ICD-10-CM

## 2013-10-15 DIAGNOSIS — E876 Hypokalemia: Secondary | ICD-10-CM

## 2013-10-15 LAB — BASIC METABOLIC PANEL (CC13)
ANION GAP: 15 meq/L — AB (ref 3–11)
BUN: 43.2 mg/dL — ABNORMAL HIGH (ref 7.0–26.0)
CALCIUM: 9.6 mg/dL (ref 8.4–10.4)
CO2: 29 mEq/L (ref 22–29)
Chloride: 93 mEq/L — ABNORMAL LOW (ref 98–109)
Creatinine: 1.6 mg/dL — ABNORMAL HIGH (ref 0.7–1.3)
GLUCOSE: 109 mg/dL (ref 70–140)
Potassium: 3.1 mEq/L — ABNORMAL LOW (ref 3.5–5.1)
SODIUM: 136 meq/L (ref 136–145)

## 2013-10-15 MED ORDER — SODIUM CHLORIDE 0.9 % IV SOLN
Freq: Once | INTRAVENOUS | Status: AC
  Administered 2013-10-15: 12:00:00 via INTRAVENOUS

## 2013-10-15 MED ORDER — POTASSIUM CHLORIDE CRYS ER 20 MEQ PO TBCR: 40.0000 meq | EXTENDED_RELEASE_TABLET | Freq: Once | ORAL | Status: DC

## 2013-10-15 MED ORDER — POTASSIUM CHLORIDE 40 MEQ/15ML (20%) PO LIQD
40.0000 meq | Freq: Once | ORAL | Status: AC
  Administered 2013-10-15: 40 meq via ORAL
  Filled 2013-10-15: qty 15

## 2013-10-15 NOTE — Patient Instructions (Signed)
Dehydration, Adult Dehydration is when you lose more fluids from the body than you take in. Vital organs like the kidneys, brain, and heart cannot function without a proper amount of fluids and salt. Any loss of fluids from the body can cause dehydration.  CAUSES   Vomiting.  Diarrhea.  Excessive sweating.  Excessive urine output.  Fever. SYMPTOMS  Mild dehydration  Thirst.  Dry lips.  Slightly dry mouth. Moderate dehydration  Very dry mouth.  Sunken eyes.  Skin does not bounce back quickly when lightly pinched and released.  Dark urine and decreased urine production.  Decreased tear production.  Headache. Severe dehydration  Very dry mouth.  Extreme thirst.  Rapid, weak pulse (more than 100 beats per minute at rest).  Cold hands and feet.  Not able to sweat in spite of heat and temperature.  Rapid breathing.  Blue lips.  Confusion and lethargy.  Difficulty being awakened.  Minimal urine production.  No tears. DIAGNOSIS  Your caregiver will diagnose dehydration based on your symptoms and your exam. Blood and urine tests will help confirm the diagnosis. The diagnostic evaluation should also identify the cause of dehydration. TREATMENT  Treatment of mild or moderate dehydration can often be done at home by increasing the amount of fluids that you drink. It is best to drink small amounts of fluid more often. Drinking too much at one time can make vomiting worse. Refer to the home care instructions below. Severe dehydration needs to be treated at the hospital where you will probably be given intravenous (IV) fluids that contain water and electrolytes. HOME CARE INSTRUCTIONS   Ask your caregiver about specific rehydration instructions.  Drink enough fluids to keep your urine clear or pale yellow.  Drink small amounts frequently if you have nausea and vomiting.  Eat as you normally do.  Avoid:  Foods or drinks high in sugar.  Carbonated  drinks.  Juice.  Extremely hot or cold fluids.  Drinks with caffeine.  Fatty, greasy foods.  Alcohol.  Tobacco.  Overeating.  Gelatin desserts.  Wash your hands well to avoid spreading bacteria and viruses.  Only take over-the-counter or prescription medicines for pain, discomfort, or fever as directed by your caregiver.  Ask your caregiver if you should continue all prescribed and over-the-counter medicines.  Keep all follow-up appointments with your caregiver. SEEK MEDICAL CARE IF:  You have abdominal pain and it increases or stays in one area (localizes).  You have a rash, stiff neck, or severe headache.  You are irritable, sleepy, or difficult to awaken.  You are weak, dizzy, or extremely thirsty. SEEK IMMEDIATE MEDICAL CARE IF:   You are unable to keep fluids down or you get worse despite treatment.  You have frequent episodes of vomiting or diarrhea.  You have blood or green matter (bile) in your vomit.  You have blood in your stool or your stool looks black and tarry.  You have not urinated in 6 to 8 hours, or you have only urinated a small amount of very dark urine.  You have a fever.  You faint. MAKE SURE YOU:   Understand these instructions.  Will watch your condition.  Will get help right away if you are not doing well or get worse. Document Released: 07/18/2005 Document Revised: 10/10/2011 Document Reviewed: 03/07/2011 Exit[employer] Patient Information 2014 [employer].  

## 2013-10-15 NOTE — Progress Notes (Signed)
Patient presents to nutrition followup.  Patient is continuing treatment for glottis cancer.  He has become dehydrated and is struggling to consume adequate calories and protein.  He is unable to tolerate pured foods.  He now has pain medication to help increase oral intake.  Dietary recall reveals patient is consuming two Ensure Plus daily, ice cream, and iced tea slushies.  Weight was documented as 203.7 pounds March 16 decreased from 207.6 pounds March.  Patient is scheduled for additional IV fluids today.  Nutrition diagnosis: Unintended weight loss continues.  Intervention: Encouraged patient to take pain medication as prescribed.  Recommended patient increase Ensure Plus to a minimum of 4 daily with eventual goal of 7 daily to meet estimated calories and protein needs.  Patient educated on strategies to increase hydration.  Teach back method used.  Monitoring, evaluation, goals: Patient will work to increase hydration, and Ensure Plus to minimize further weight loss.  Next visit: Friday, March 20, before radiation therapy.

## 2013-10-16 ENCOUNTER — Ambulatory Visit
Admission: RE | Admit: 2013-10-16 | Discharge: 2013-10-16 | Disposition: A | Discharge status: Home / Self Care | Payer: Medicare Other | Source: Ambulatory Visit | Attending: Radiation Oncology | Admitting: Radiation Oncology

## 2013-10-16 ENCOUNTER — Other Ambulatory Visit: Payer: Self-pay | Admitting: Radiation Oncology

## 2013-10-16 ENCOUNTER — Other Ambulatory Visit: Payer: Self-pay | Admitting: *Deleted

## 2013-10-16 ENCOUNTER — Ambulatory Visit: Admission: RE | Admit: 2013-10-16 | Payer: Medicare Other | Source: Ambulatory Visit

## 2013-10-16 ENCOUNTER — Telehealth: Payer: Self-pay | Admitting: *Deleted

## 2013-10-16 ENCOUNTER — Other Ambulatory Visit: Payer: Self-pay | Admitting: Medical Oncology

## 2013-10-16 DIAGNOSIS — C320 Malignant neoplasm of glottis: Secondary | ICD-10-CM

## 2013-10-16 DIAGNOSIS — E876 Hypokalemia: Secondary | ICD-10-CM

## 2013-10-16 LAB — BASIC METABOLIC PANEL (CC13)
Anion Gap: 11 mEq/L (ref 3–11)
BUN: 31.3 mg/dL — ABNORMAL HIGH (ref 7.0–26.0)
CALCIUM: 9.2 mg/dL (ref 8.4–10.4)
CO2: 29 meq/L (ref 22–29)
CREATININE: 1.4 mg/dL — AB (ref 0.7–1.3)
Chloride: 93 mEq/L — ABNORMAL LOW (ref 98–109)
Glucose: 109 mg/dl (ref 70–140)
Potassium: 3 mEq/L — CL (ref 3.5–5.1)
Sodium: 134 mEq/L — ABNORMAL LOW (ref 136–145)

## 2013-10-16 NOTE — Telephone Encounter (Signed)
Derrick Ayala called and informed that [name] has ordered additional IV hydration on tomorrow in the Medical Oncology Infusion area.  Also informed that he will receive IV Potassium and one oral dose of Potassium.  His visit will be ~ 2 hrs.  His time will be between 1000am and 1100 and he is aware.

## 2013-10-17 ENCOUNTER — Other Ambulatory Visit (HOSPITAL_COMMUNITY): Payer: Medicare Other

## 2013-10-17 ENCOUNTER — Ambulatory Visit
Admission: RE | Admit: 2013-10-17 | Discharge: 2013-10-17 | Disposition: A | Discharge status: Home / Self Care | Payer: Medicare Other | Source: Ambulatory Visit | Attending: Radiation Oncology | Admitting: Radiation Oncology

## 2013-10-17 ENCOUNTER — Ambulatory Visit (HOSPITAL_COMMUNITY): Payer: Medicare Other

## 2013-10-17 ENCOUNTER — Other Ambulatory Visit: Payer: Self-pay | Admitting: Radiation Oncology

## 2013-10-17 ENCOUNTER — Other Ambulatory Visit: Payer: Medicare Other

## 2013-10-17 ENCOUNTER — Telehealth: Payer: Self-pay | Admitting: *Deleted

## 2013-10-17 ENCOUNTER — Ambulatory Visit: Payer: Medicare Other

## 2013-10-17 VITALS — BP 112/74 | HR 134 | Temp 97.6°F | Resp 20

## 2013-10-17 DIAGNOSIS — C320 Malignant neoplasm of glottis: Secondary | ICD-10-CM

## 2013-10-17 DIAGNOSIS — E860 Dehydration: Secondary | ICD-10-CM

## 2013-10-17 LAB — BASIC METABOLIC PANEL (CC13)
Anion Gap: 12 mEq/L — ABNORMAL HIGH (ref 3–11)
BUN: 28.1 mg/dL — AB (ref 7.0–26.0)
CALCIUM: 9.4 mg/dL (ref 8.4–10.4)
CHLORIDE: 94 meq/L — AB (ref 98–109)
CO2: 28 meq/L (ref 22–29)
Creatinine: 1.5 mg/dL — ABNORMAL HIGH (ref 0.7–1.3)
GLUCOSE: 109 mg/dL (ref 70–140)
Potassium: 3 mEq/L — CL (ref 3.5–5.1)
Sodium: 134 mEq/L — ABNORMAL LOW (ref 136–145)

## 2013-10-17 LAB — MAGNESIUM (CC13): Magnesium: 2.5 mg/dl (ref 1.5–2.5)

## 2013-10-17 MED ORDER — SODIUM CHLORIDE 0.9 % IV SOLN
Freq: Once | INTRAVENOUS | Status: DC
  Administered 2013-10-17: 11:00:00 via INTRAVENOUS
  Filled 2013-10-17: qty 1000

## 2013-10-17 MED FILL — Potassium Chloride Oral Soln 10% (20 MEQ/15ML): ORAL | Qty: 30 | Status: AC

## 2013-10-17 NOTE — Patient Instructions (Signed)
Hypokalemia Hypokalemia means that the amount of potassium in the blood is lower than normal.Potassium is a chemical, called an electrolyte, that helps regulate the amount of fluid in the body. It also stimulates muscle contraction and helps nerves function properly.Most of the body's potassium is inside of cells, and only a very small amount is in the blood. Because the amount in the blood is so small, minor changes can be life-threatening. CAUSES  Antibiotics.  Diarrhea or vomiting.  Using laxatives too much, which can cause diarrhea.  Chronic kidney disease.  Water pills (diuretics).  Eating disorders (bulimia).  Low magnesium level.  Sweating a lot. SIGNS AND SYMPTOMS  Weakness.  Constipation.  Fatigue.  Muscle cramps.  Mental confusion.  Skipped heartbeats or irregular heartbeat (palpitations).  Tingling or numbness. DIAGNOSIS  Your health care provider can diagnose hypokalemia with blood tests. In addition to checking your potassium level, your health care provider may also check other lab tests. TREATMENT Hypokalemia can be treated with potassium supplements taken by mouth or adjustments in your current medicines. If your potassium level is very low, you may need to get potassium through a vein (IV) and be monitored in the hospital. A diet high in potassium is also helpful. Foods high in potassium are:  Nuts, such as peanuts and pistachios.  Seeds, such as sunflower seeds and pumpkin seeds.  Peas, lentils, and lima beans.  Whole grain and bran cereals and breads.  Fresh fruit and vegetables, such as apricots, avocado, bananas, cantaloupe, kiwi, oranges, tomatoes, asparagus, and potatoes.  Orange and tomato juices.  Red meats.  Fruit yogurt. HOME CARE INSTRUCTIONS  Take all medicines as prescribed by your health care provider.  Maintain a healthy diet by including nutritious food, such as fruits, vegetables, nuts, whole grains, and lean meats.  If  you are taking a laxative, be sure to follow the directions on the label. SEEK MEDICAL CARE IF:  Your weakness gets worse.  You feel your heart pounding or racing.  You are vomiting or having diarrhea.  You are diabetic and having trouble keeping your blood glucose in the normal range. SEEK IMMEDIATE MEDICAL CARE IF:  You have chest pain, shortness of breath, or dizziness.  You are vomiting or having diarrhea for more than 2 days.  You faint. MAKE SURE YOU:   Understand these instructions.  Will watch your condition.  Will get help right away if you are not doing well or get worse. Document Released: 07/18/2005 Document Revised: 05/08/2013 Document Reviewed: 01/18/2013 [employer] Patient Information 2014 [employer].  

## 2013-10-17 NOTE — Telephone Encounter (Signed)
Spoke with Cira Rue, RN,Charge nurse in Med/Onc dept, patiet to go to lobby after Rad tx and register for labs and IVF's, and have [name] put order for the IVF'S, [name] is off today but will notify her nurse Shawnie Dapper and on call MD,thanked Robin 8:46 AM

## 2013-10-18 ENCOUNTER — Telehealth: Payer: Self-pay | Admitting: *Deleted

## 2013-10-18 ENCOUNTER — Ambulatory Visit
Admission: RE | Admit: 2013-10-18 | Discharge: 2013-10-18 | Disposition: A | Discharge status: Home / Self Care | Payer: Medicare Other | Source: Ambulatory Visit | Attending: Radiation Oncology | Admitting: Radiation Oncology

## 2013-10-18 ENCOUNTER — Ambulatory Visit: Payer: Medicare Other | Admitting: Nutrition

## 2013-10-18 ENCOUNTER — Other Ambulatory Visit: Payer: Self-pay | Admitting: Radiation Oncology

## 2013-10-18 ENCOUNTER — Ambulatory Visit: Payer: Medicare Other

## 2013-10-18 VITALS — BP 88/51 | HR 81 | Temp 98.4°F | Resp 20 | Wt 201.3 lb

## 2013-10-18 VITALS — BP 121/56 | HR 114 | Temp 98.0°F | Resp 18

## 2013-10-18 DIAGNOSIS — C320 Malignant neoplasm of glottis: Secondary | ICD-10-CM

## 2013-10-18 DIAGNOSIS — E860 Dehydration: Secondary | ICD-10-CM

## 2013-10-18 LAB — BASIC METABOLIC PANEL (CC13)
ANION GAP: 14 meq/L — AB (ref 3–11)
BUN: 24.3 mg/dL (ref 7.0–26.0)
CO2: 28 mEq/L (ref 22–29)
Calcium: 9.6 mg/dL (ref 8.4–10.4)
Chloride: 98 mEq/L (ref 98–109)
Creatinine: 1.5 mg/dL — ABNORMAL HIGH (ref 0.7–1.3)
GLUCOSE: 110 mg/dL (ref 70–140)
Potassium: 3.5 mEq/L (ref 3.5–5.1)
Sodium: 140 mEq/L (ref 136–145)

## 2013-10-18 MED ORDER — SCOPOLAMINE 1 MG/3DAYS TD PT72: 1.0000 | MEDICATED_PATCH | TRANSDERMAL | Status: DC

## 2013-10-18 MED ORDER — SODIUM CHLORIDE 0.9 % IV SOLN
Freq: Once | INTRAVENOUS | Status: AC
  Administered 2013-10-18: 12:00:00 via INTRAVENOUS
  Filled 2013-10-18: qty 1000

## 2013-10-18 MED ORDER — SODIUM CHLORIDE 0.9 % IV SOLN
INTRAVENOUS | Status: DC
  Filled 2013-10-18: qty 1000

## 2013-10-18 NOTE — Progress Notes (Signed)
Mr. Tengan completed 73/30 tmt today.  He reports pain with swallowing that is relieved by Hycet.  He reports copious amts of phlegm that affects his swallowing, "after I clear my throat I can swallow".  He reports fatigue and states "I'm pushing myself to finish".  He is not eating solids but manages liquids such as tomato soup, milk, ice tea.  Started Juven today per appt with nutritionist Dory Peru.  Neck is dry with some dry desquamation; he is applying Biafine BID.  He reports his bowels are moving daily.

## 2013-10-18 NOTE — Telephone Encounter (Signed)
CALLED PATIENT TO ASK ABOUT COMING IN FOR LABS ON Monday 10-21-13, PATIENT'S WIFE STATED THAT HE WOULD BE THERE.

## 2013-10-18 NOTE — Progress Notes (Signed)
Patient presents to nutrition followup.  Patient has been unable to increase fluids.  He appears to have difficulty with "milky" products.  Patient did not drink any ensure yesterday.  He reports that he was able to drink some tomato soup and iced tea this morning.  Patient reports swallowing is getting better.  He does report increased mucus production.  He is able to drink more after he coughs up excess mucus.  Patient given orange boost breeze to try this morning with lots of ice.  He reports that he tolerates this well.  Nutrition diagnosis: Unintended weight loss continues.  Intervention: Patient educated to work to increase fluids, especially over this weekend.  Patient encouraged to take pain medication as prescribed.  Juice-based oral nutrition supplements provided for patient.  Questions were answered.  Teach back method used.  Monitoring, evaluation, goals: Patient will continue to work to increase hydration, and increase nutritional supplements.  Next visit: I will check with patient next week.  He has my phone number for questions or concerns.

## 2013-10-18 NOTE — Patient Instructions (Signed)
Dehydration, Adult Dehydration is when you lose more fluids from the body than you take in. Vital organs like the kidneys, brain, and heart cannot function without a proper amount of fluids and salt. Any loss of fluids from the body can cause dehydration.  CAUSES   Vomiting.  Diarrhea.  Excessive sweating.  Excessive urine output.  Fever. SYMPTOMS  Mild dehydration  Thirst.  Dry lips.  Slightly dry mouth. Moderate dehydration  Very dry mouth.  Sunken eyes.  Skin does not bounce back quickly when lightly pinched and released.  Dark urine and decreased urine production.  Decreased tear production.  Headache. Severe dehydration  Very dry mouth.  Extreme thirst.  Rapid, weak pulse (more than 100 beats per minute at rest).  Cold hands and feet.  Not able to sweat in spite of heat and temperature.  Rapid breathing.  Blue lips.  Confusion and lethargy.  Difficulty being awakened.  Minimal urine production.  No tears. DIAGNOSIS  Your caregiver will diagnose dehydration based on your symptoms and your exam. Blood and urine tests will help confirm the diagnosis. The diagnostic evaluation should also identify the cause of dehydration. TREATMENT  Treatment of mild or moderate dehydration can often be done at home by increasing the amount of fluids that you drink. It is best to drink small amounts of fluid more often. Drinking too much at one time can make vomiting worse. Refer to the home care instructions below. Severe dehydration needs to be treated at the hospital where you will probably be given intravenous (IV) fluids that contain water and electrolytes. HOME CARE INSTRUCTIONS   Ask your caregiver about specific rehydration instructions.  Drink enough fluids to keep your urine clear or pale yellow.  Drink small amounts frequently if you have nausea and vomiting.  Eat as you normally do.  Avoid:  Foods or drinks high in sugar.  Carbonated  drinks.  Juice.  Extremely hot or cold fluids.  Drinks with caffeine.  Fatty, greasy foods.  Alcohol.  Tobacco.  Overeating.  Gelatin desserts.  Wash your hands well to avoid spreading bacteria and viruses.  Only take over-the-counter or prescription medicines for pain, discomfort, or fever as directed by your caregiver.  Ask your caregiver if you should continue all prescribed and over-the-counter medicines.  Keep all follow-up appointments with your caregiver. SEEK MEDICAL CARE IF:  You have abdominal pain and it increases or stays in one area (localizes).  You have a rash, stiff neck, or severe headache.  You are irritable, sleepy, or difficult to awaken.  You are weak, dizzy, or extremely thirsty. SEEK IMMEDIATE MEDICAL CARE IF:   You are unable to keep fluids down or you get worse despite treatment.  You have frequent episodes of vomiting or diarrhea.  You have blood or green matter (bile) in your vomit.  You have blood in your stool or your stool looks black and tarry.  You have not urinated in 6 to 8 hours, or you have only urinated a small amount of very dark urine.  You have a fever.  You faint. MAKE SURE YOU:   Understand these instructions.  Will watch your condition.  Will get help right away if you are not doing well or get worse. Document Released: 07/18/2005 Document Revised: 10/10/2011 Document Reviewed: 03/07/2011 Exit[employer] Patient Information 2014 [employer].  

## 2013-10-18 NOTE — Progress Notes (Signed)
   Weekly Management Note:  Outpatient Current Dose:  56.25 Gy  Projected Dose: 65.25 Gy   Narrative:  The patient presents for routine under treatment assessment.  CBCT/MVCT images/Port film x-rays were reviewed.  The chart was checked. He is doing well. He reports that he's feeling better. His swallowing is a little better. He has been receiving IV fluids this week and his kidney function is stabilizing. He still reports copious secretions in his throat which are bothersome and may be affecting his swallowing.  Physical Findings:  weight is 201 lb 4.8 oz (91.309 kg). His temperature is 98.4 F (36.9 C). His blood pressure is 88/51 and his pulse is 81. His respiration is 20 and oxygen saturation is 98%.  dry desquamation of the anterior neck. Thick secretions in his upper pharynx.  CBC    Component Value Date/Time   WBC 4.9 10/14/2013 1133   RBC 4.30 10/14/2013 1133   HGB 14.0 10/14/2013 1133   HCT 41.6 10/14/2013 1133   PLT 179 10/14/2013 1133   MCV 96.9 10/14/2013 1133   MCH 32.5 10/14/2013 1133   MCHC 33.6 10/14/2013 1133   RDW 12.5 10/14/2013 1133   LYMPHSABS 0.7* 10/14/2013 1133   MONOABS 0.8 10/14/2013 1133   EOSABS 0.1 10/14/2013 1133   BASOSABS 0.0 10/14/2013 1133   CMP     Component Value Date/Time   NA 140 10/18/2013 0912   K 3.5 10/18/2013 0912   CO2 28 10/18/2013 0912   GLUCOSE 110 10/18/2013 0912   BUN 24.3 10/18/2013 0912   CREATININE 1.5* 10/18/2013 0912   CALCIUM 9.6 10/18/2013 0912     Impression:  The patient is tolerating radiotherapy.  Plan:  Continue radiotherapy as planned. He is still orthostatic; this may be in part due to dehydration. He still reports some dysphagia related to the secretions in his throat. It's hard to quantify how much he is taking by mouth but I think his by mouth intake is still suboptimal. We will give IV fluids again today to prevent dehydration and issues over the weekend. I also will prescribe scopolamine for his thickened secretions. Plan  discussed with patient detail and he is enthusiastic. He knows to stop the scopolamine if it causes lightheadedness or dizziness or interferes with ambulation  ________________________________   Eppie Gibson, M.D.

## 2013-10-18 NOTE — Telephone Encounter (Signed)
CALLED PATIENT TO ASK TO COME IN EARLY FOR STAT LABS, SPOKE WITH PATIENT'S WIFE AND SHE STATED THAT HER HUSBAND HAS ALREADY LEFT AND ON HIS WAY HERE.

## 2013-10-21 ENCOUNTER — Ambulatory Visit
Admission: RE | Admit: 2013-10-21 | Discharge: 2013-10-21 | Disposition: A | Discharge status: Home / Self Care | Payer: Medicare Other | Source: Ambulatory Visit | Attending: Radiation Oncology | Admitting: Radiation Oncology

## 2013-10-21 ENCOUNTER — Telehealth: Payer: Self-pay | Admitting: *Deleted

## 2013-10-21 ENCOUNTER — Encounter: Payer: Self-pay | Admitting: Radiation Oncology

## 2013-10-21 VITALS — BP 119/74 | HR 93 | Temp 98.5°F | Resp 20 | Wt 200.6 lb

## 2013-10-21 DIAGNOSIS — C320 Malignant neoplasm of glottis: Secondary | ICD-10-CM

## 2013-10-21 LAB — BASIC METABOLIC PANEL (CC13)
ANION GAP: 10 meq/L (ref 3–11)
BUN: 28.7 mg/dL — ABNORMAL HIGH (ref 7.0–26.0)
CO2: 29 mEq/L (ref 22–29)
CREATININE: 1.4 mg/dL — AB (ref 0.7–1.3)
Calcium: 9.4 mg/dL (ref 8.4–10.4)
Chloride: 95 mEq/L — ABNORMAL LOW (ref 98–109)
Glucose: 105 mg/dl (ref 70–140)
Potassium: 3.1 mEq/L — ABNORMAL LOW (ref 3.5–5.1)
SODIUM: 133 meq/L — AB (ref 136–145)

## 2013-10-21 LAB — MAGNESIUM (CC13): Magnesium: 2.1 mg/dl (ref 1.5–2.5)

## 2013-10-21 MED ORDER — SODIUM CHLORIDE 0.9 % IV SOLN
Freq: Once | INTRAVENOUS | Status: DC
  Filled 2013-10-21: qty 1000

## 2013-10-21 NOTE — Telephone Encounter (Signed)
Called patient to inform of appt. For fluids and lab and follow-up in April, spoke with patient and he is aware of these appts.

## 2013-10-21 NOTE — Progress Notes (Signed)
Pt denies pain, fatigue, loss of appetite. He states his niece visited him this past weekend and applied olive oil to his neck area. He states he "felt immediate relief of that scratching feeling of his skin". He is applying Biafine cream twice daily to treatment area of neck. Pt states he has been eating baby food, drinking Boost, 2-3 cans daily and nutritional juices each day. Pt is taking Hycet for throat pain 4 times daily w/good relief. Pt continues to have thick saliva.

## 2013-10-21 NOTE — Progress Notes (Signed)
   Weekly Management Note:  Outpatient Current Dose:  58.5 Gy  Projected Dose: 65.25 Gy   Narrative:  The patient presents for routine under treatment assessment.  CBCT/MVCT images/Port film x-rays were reviewed.  The chart was checked. Feels the weekend went well. He reports tolerating baby food and nutritional shakes and fluid.  Thinks he is taking more by mouth.  He is taking Micardis again which has HCTZ in it.  His PCP is Dibas Koirala. He canceled his barium swallow study again last week. Doesn't want to do the study.  Did not fill scopolamine yet.  Applying olive oil to skin.  Pain controlled with hycet.  Physical Findings:  weight is 200 lb 9.6 oz (90.992 kg). His temperature is 98.5 F (36.9 C). His blood pressure is 119/74 and his pulse is 93. His respiration is 20.  moist desquamation over neck anteriorly.  Oropharynx clear.  CBC    Component Value Date/Time   WBC 4.9 10/14/2013 1133   RBC 4.30 10/14/2013 1133   HGB 14.0 10/14/2013 1133   HCT 41.6 10/14/2013 1133   PLT 179 10/14/2013 1133   MCV 96.9 10/14/2013 1133   MCH 32.5 10/14/2013 1133   MCHC 33.6 10/14/2013 1133   RDW 12.5 10/14/2013 1133   LYMPHSABS 0.7* 10/14/2013 1133   MONOABS 0.8 10/14/2013 1133   EOSABS 0.1 10/14/2013 1133   BASOSABS 0.0 10/14/2013 1133     CMP     Component Value Date/Time   NA 133* 10/21/2013 0920   K 3.1* 10/21/2013 0920   CO2 29 10/21/2013 0920   GLUCOSE 105 10/21/2013 0920   BUN 28.7* 10/21/2013 0920   CREATININE 1.4* 10/21/2013 0920   CALCIUM 9.4 10/21/2013 0920     Impression:  The patient is tolerating radiotherapy.   Plan:  Continue radiotherapy as planned.   Good compliance with attendance for RT, varied compliance with other recommendations.  Will not reschedule swallowing study, patient declined this twice.  Advised to try scopolamine for copious secretions which affect swallowing  Try silvadene to heal skin  Hold Micardis until systolic BP >=017; he is hypokalemic.  Mildly  orthostatic today by not symptomatic with better PO intake.  Give IVF tomorrow, then recheck labs on Wed to determine disposition for possible IVF on Thurs or Friday.  His PCP is Dibas Koirala. We don't have baseline labs in our EPIC system. Will ask my staff to obtain these as he may be at his baseline for his BMP.  F/u 1 wk after finishing RT -----------------------------------  Eppie Gibson, MD

## 2013-10-22 ENCOUNTER — Ambulatory Visit: Payer: Medicare Other

## 2013-10-22 ENCOUNTER — Ambulatory Visit
Admission: RE | Admit: 2013-10-22 | Discharge: 2013-10-22 | Disposition: A | Discharge status: Home / Self Care | Payer: Medicare Other | Source: Ambulatory Visit | Attending: Radiation Oncology | Admitting: Radiation Oncology

## 2013-10-22 VITALS — BP 124/64 | HR 84 | Temp 98.3°F

## 2013-10-22 DIAGNOSIS — E876 Hypokalemia: Secondary | ICD-10-CM | POA: Insufficient documentation

## 2013-10-22 MED ORDER — SODIUM CHLORIDE 0.9 % IV SOLN
Freq: Once | INTRAVENOUS | Status: AC
  Administered 2013-10-22: 09:00:00 via INTRAVENOUS
  Filled 2013-10-22: qty 1000

## 2013-10-22 NOTE — Patient Instructions (Signed)
Hypokalemia Hypokalemia means that the amount of potassium in the blood is lower than normal.Potassium is a chemical, called an electrolyte, that helps regulate the amount of fluid in the body. It also stimulates muscle contraction and helps nerves function properly.Most of the body's potassium is inside of cells, and only a very small amount is in the blood. Because the amount in the blood is so small, minor changes can be life-threatening. CAUSES  Antibiotics.  Diarrhea or vomiting.  Using laxatives too much, which can cause diarrhea.  Chronic kidney disease.  Water pills (diuretics).  Eating disorders (bulimia).  Low magnesium level.  Sweating a lot. SIGNS AND SYMPTOMS  Weakness.  Constipation.  Fatigue.  Muscle cramps.  Mental confusion.  Skipped heartbeats or irregular heartbeat (palpitations).  Tingling or numbness. DIAGNOSIS  Your health care provider can diagnose hypokalemia with blood tests. In addition to checking your potassium level, your health care provider may also check other lab tests. TREATMENT Hypokalemia can be treated with potassium supplements taken by mouth or adjustments in your current medicines. If your potassium level is very low, you may need to get potassium through a vein (IV) and be monitored in the hospital. A diet high in potassium is also helpful. Foods high in potassium are:  Nuts, such as peanuts and pistachios.  Seeds, such as sunflower seeds and pumpkin seeds.  Peas, lentils, and lima beans.  Whole grain and bran cereals and breads.  Fresh fruit and vegetables, such as apricots, avocado, bananas, cantaloupe, kiwi, oranges, tomatoes, asparagus, and potatoes.  Orange and tomato juices.  Red meats.  Fruit yogurt. HOME CARE INSTRUCTIONS  Take all medicines as prescribed by your health care provider.  Maintain a healthy diet by including nutritious food, such as fruits, vegetables, nuts, whole grains, and lean meats.  If  you are taking a laxative, be sure to follow the directions on the label. SEEK MEDICAL CARE IF:  Your weakness gets worse.  You feel your heart pounding or racing.  You are vomiting or having diarrhea.  You are diabetic and having trouble keeping your blood glucose in the normal range. SEEK IMMEDIATE MEDICAL CARE IF:  You have chest pain, shortness of breath, or dizziness.  You are vomiting or having diarrhea for more than 2 days.  You faint. MAKE SURE YOU:   Understand these instructions.  Will watch your condition.  Will get help right away if you are not doing well or get worse. Document Released: 07/18/2005 Document Revised: 05/08/2013 Document Reviewed: 01/18/2013 [employer] Patient Information 2014 [employer].  

## 2013-10-23 ENCOUNTER — Other Ambulatory Visit: Payer: Self-pay | Admitting: Radiation Oncology

## 2013-10-23 ENCOUNTER — Telehealth: Payer: Self-pay | Admitting: *Deleted

## 2013-10-23 ENCOUNTER — Ambulatory Visit
Admission: RE | Admit: 2013-10-23 | Discharge: 2013-10-23 | Disposition: A | Discharge status: Home / Self Care | Payer: Medicare Other | Source: Ambulatory Visit | Attending: Radiation Oncology | Admitting: Radiation Oncology

## 2013-10-23 DIAGNOSIS — C320 Malignant neoplasm of glottis: Secondary | ICD-10-CM

## 2013-10-23 LAB — BASIC METABOLIC PANEL (CC13)
Anion Gap: 10 mEq/L (ref 3–11)
BUN: 19.2 mg/dL (ref 7.0–26.0)
CALCIUM: 9.4 mg/dL (ref 8.4–10.4)
CO2: 28 mEq/L (ref 22–29)
Chloride: 98 mEq/L (ref 98–109)
Creatinine: 1.2 mg/dL (ref 0.7–1.3)
Glucose: 100 mg/dl (ref 70–140)
POTASSIUM: 3.2 meq/L — AB (ref 3.5–5.1)
Sodium: 136 mEq/L (ref 136–145)

## 2013-10-23 NOTE — Telephone Encounter (Signed)
CALLED PATIENT TO INFORM OF GOOD NEWS ABOUT LABS PER DR. SQUIRE AND TO INFORM OF STAT LABS AND FU VISIT ON 10-30-13, SPOKE WITH PATIENT AND HE IS AWARE OF THE NEWS AND THE APPTS.

## 2013-10-23 NOTE — Telephone Encounter (Signed)
Called patient to give information, lvm for a return call

## 2013-10-24 ENCOUNTER — Ambulatory Visit
Admission: RE | Admit: 2013-10-24 | Discharge: 2013-10-24 | Disposition: A | Discharge status: Home / Self Care | Payer: Medicare Other | Source: Ambulatory Visit | Attending: Radiation Oncology | Admitting: Radiation Oncology

## 2013-10-24 ENCOUNTER — Encounter: Payer: Self-pay | Admitting: *Deleted

## 2013-10-24 ENCOUNTER — Encounter: Payer: Self-pay | Admitting: Radiation Oncology

## 2013-10-24 VITALS — BP 134/80 | HR 92 | Temp 98.8°F | Wt 200.5 lb

## 2013-10-24 DIAGNOSIS — C320 Malignant neoplasm of glottis: Secondary | ICD-10-CM

## 2013-10-24 NOTE — Progress Notes (Signed)
Met with pt during final RT to offer support and to celebrate end of radiation treatment.  I explained that my role as navigator will continue for several more months and that I will be calling and/or joining them during follow-up visits.  Pt verbalized understanding.  Initiating navigation as L3 patient (treatments completed) with this encounter.  Gayleen Orem, RN, BSN, [facility] Neck Oncology Navigator [phone]

## 2013-10-24 NOTE — Progress Notes (Signed)
Weekly Management Note:  Site: Larynx/neck Current Dose:  6525  cGy Projected Dose: 6525  cGy  Narrative: The patient is seen today for routine under treatment assessment. CBCT/MVCT images/port films were reviewed. The chart was reviewed.   He still doing well. He does take high sed for pain on swallowing. He is applying Silvadene cream along his anterior neck where he has a moist desquamation. He finishes his radiation therapy today.  Physical Examination: There were no vitals filed for this visit..  Weight:  . On inspection of the neck there is moist desquamation and depigmentation of the skin.  Impression: Radiation therapy reasonably well tolerated except for moist desquamation along his anterior neck.  Plan: Radiation therapy completed. Followup visit with [name] next week.

## 2013-10-24 NOTE — Progress Notes (Signed)
Mr. Vayda has finished RT today.  No complaint of pain.  Able to eat soft foods and drink with aid of MMW.  BMs regular, usually daily.  Skin on neck irritated, evidence of dry desquamation; applying Biafine BID.  Energy level "moderate".  Secretions moderate, not using Scopolamine yet as prescribed by [name] b/c not available at his pharmacy at the time prescribed; was told wd be available in a few days.

## 2013-10-27 NOTE — Progress Notes (Signed)
  Radiation Oncology         [phone] ________________________________  Name: Amier Hoyt MRN: [id number]  Date: 10/24/2013  DOB: 07-08-1935  End of Treatment Note  Diagnosis:  T2N0M0 Glottic Squamous Cell Carcinoma    Indication for treatment:  curative       Radiation treatment dates:   09/16/2013-10/24/2013  Site/dose:  Larynx / 65.25 Gy in 29 fractions  Beams/energy:   Opposed laterals / 6MV photons  Narrative: The patient tolerated radiation treatment relatively well, but did struggle with dysphagia and weight loss. He refused SLP consult and barium swallow studies.  He did not have a PEG tube, but received multiple sessions of IV fluids.  Due to Hypokalemia, KCl was given orally and by IV, and Micardis was held.  He lost approximately 24 lbs. Scopolamine was Rx'd for secretions in throat, Hycet for throat pain, and Silvadene for moist desquamation of his neck. His BMP demonstrated improvement in his renal function and electrolytes with IV fluids.  Plan: The patient has completed radiation treatment. The patient will return to radiation oncology clinic for routine followup in less than 1 week with BMP labs. I advised them to call or return sooner if they have any questions or concerns related to their recovery or treatment.  -----------------------------------  Eppie Gibson, MD

## 2013-10-30 ENCOUNTER — Ambulatory Visit
Admission: RE | Admit: 2013-10-30 | Discharge: 2013-10-30 | Disposition: A | Discharge status: Home / Self Care | Payer: Medicare Other | Source: Ambulatory Visit | Attending: Radiation Oncology | Admitting: Radiation Oncology

## 2013-10-30 ENCOUNTER — Encounter: Payer: Self-pay | Admitting: *Deleted

## 2013-10-30 ENCOUNTER — Encounter: Payer: Self-pay | Admitting: Radiation Oncology

## 2013-10-30 ENCOUNTER — Other Ambulatory Visit: Payer: Self-pay | Admitting: *Deleted

## 2013-10-30 ENCOUNTER — Other Ambulatory Visit: Payer: Self-pay | Admitting: Radiation Oncology

## 2013-10-30 ENCOUNTER — Non-Acute Institutional Stay (HOSPITAL_COMMUNITY)
Admission: AD | Admit: 2013-10-30 | Discharge: 2013-10-30 | Disposition: A | Discharge status: Home / Self Care | Payer: Medicare Other | Source: Ambulatory Visit | Attending: Radiation Oncology | Admitting: Radiation Oncology

## 2013-10-30 VITALS — BP 106/66 | HR 87 | Temp 98.0°F | Ht 71.0 in | Wt 191.1 lb

## 2013-10-30 DIAGNOSIS — C320 Malignant neoplasm of glottis: Secondary | ICD-10-CM

## 2013-10-30 HISTORY — DX: Hypokalemia: E87.6

## 2013-10-30 HISTORY — DX: Personal history of irradiation: Z92.3

## 2013-10-30 LAB — BASIC METABOLIC PANEL (CC13)
ANION GAP: 12 meq/L — AB (ref 3–11)
BUN: 33.4 mg/dL — ABNORMAL HIGH (ref 7.0–26.0)
CO2: 31 meq/L — AB (ref 22–29)
Calcium: 9.5 mg/dL (ref 8.4–10.4)
Chloride: 92 mEq/L — ABNORMAL LOW (ref 98–109)
Creatinine: 1.9 mg/dL — ABNORMAL HIGH (ref 0.7–1.3)
Glucose: 101 mg/dl (ref 70–140)
Potassium: 3 mEq/L — CL (ref 3.5–5.1)
SODIUM: 135 meq/L — AB (ref 136–145)

## 2013-10-30 MED ORDER — HYDROCODONE-ACETAMINOPHEN 7.5-325 MG/15ML PO SOLN: 10.0000 mL | ORAL | Status: DC | PRN

## 2013-10-30 MED ORDER — POTASSIUM CHLORIDE 2 MEQ/ML IV SOLN
INTRAVENOUS | Status: AC
Start: 2013-10-30 — End: 2013-10-30
  Administered 2013-10-30: 11:00:00 via INTRAVENOUS
  Filled 2013-10-30: qty 1000

## 2013-10-30 MED ORDER — SODIUM CHLORIDE 0.9 % IV SOLN: INTRAVENOUS | Status: DC

## 2013-10-30 NOTE — Progress Notes (Signed)
Derrick Ayala has been scheduled to receive [facility] with [facility] as ordered by [name].

## 2013-10-30 NOTE — Progress Notes (Addendum)
Mr. Derrick Ayala is here today s/p radiation to the Left Glottis which completed on 10/24/13.  He is hoarse today and he reports soreness of his throat. His mouth is moist and without any irritation.  Poor dentition lower anterior teeth.  Expectorating thick clear sputum presently.    He drinking liquids and 2 ensure daily.  Given advice on adding protein powder to ensure and other liquids since he is not eating solids. He has lost 9 lbs since 10/24/13  .  Cleanse old Silvadene from desquamated area on his anterior neck.  Mild bleeding occurred even with gentle wiping.  Note beginning of reepithelializing of this area.

## 2013-10-30 NOTE — Patient Instructions (Signed)
1) Refill pain elixir if needed 2) continue silvadene on skin 3) do not resume Micardis (blood pressure medication) until a Doctor tells you to do so 4) we will give you IV fluids today and tomorrow and reassess your labwork Friday  5) Please swallow as much fluid as you can - especially ones with protein - ie nutritional shakes or whole milk - goal of at least 12 cups/ daily 6) we will try to get our nutritionist to meet with you again. 7) call if any concerns [phone]  -----------------------------------  Eppie Gibson, MD

## 2013-10-30 NOTE — Progress Notes (Signed)
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  Narrative:  The patient returns today for routine follow-up. He is hoarse today and his throat pain is getter better. He is drinking liquids and 2 ensure daily. Trouble with solids, still, but overall swallowing is better. He has declined SLP evaluation.  He resumed his Micardis despite recommendations to hold it.  Yesterday he was lightheaded so he did not take it today.  He didn't fill the Rx for scopolamine.  Secretions in throat are a bit better.  He has lost 9 lbs since 10/24/13   Applying Silvadene to neck. Needs refills on this, And Hycet.                              ALLERGIES:  has No Known Allergies.  Meds: Current Outpatient Prescriptions  Medication Sig Dispense Refill  . Alum & Mag Hydroxide-Simeth (MAGIC MOUTHWASH W/LIDOCAINE) SOLN 1part nystatin, 1part benadryl, 2 parts 2% viscous lidocaine. Swish/swallow 10 mL up to QID, 23min before meals/bedtime.  480 mL  2  . docusate sodium (COLACE) 100 MG capsule Take 1 capsule (100 mg total) by mouth 2 (two) times daily. To prevent constipation - take while on Hycet narcotic.  60 capsule  0  . emollient (BIAFINE) cream Apply 1 application topically daily. Apply bid after radiation and at bedtime to affected skin area on neck and on weekends  2 x day      . guaiFENesin (MUCINEX) 600 MG 12 hr tablet Take 600 mg by mouth as needed.      Marland Kitchen HYDROcodone-acetaminophen (HYCET) 7.5-325 mg/15 ml solution Take 10-15 mLs by mouth every 4 (four) hours as needed for moderate pain.  473 mL  0  . Pseudoephedrine HCl (SINUS & ALLERGY 12 HOUR PO) Take 1 tablet by mouth as  needed.      . tamsulosin (FLOMAX) 0.4 MG CAPS capsule Take 0.4 mg by mouth daily after breakfast.      . scopolamine (TRANSDERM-SCOP) 1 MG/3DAYS Place 1 patch (1.5 mg total) onto the skin every 3 (three) days.  4 patch  1  . silver sulfADIAZINE (SILVADENE) 1 % cream Apply 1 application topically daily.      Marland Kitchen telmisartan-hydrochlorothiazide (MICARDIS HCT) 80-25 MG per tablet Take 1 tablet by mouth daily.       No current facility-administered medications for this encounter.    Physical Findings: The patient is in no acute distress. Patient is alert and oriented.  height is 5\' 11"  (1.803 m) and weight is 191 lb 1.6 oz (86.682 kg). His temperature is 98 F (36.7 C). His blood pressure is 106/66 and his pulse is 87. Marland Kitchen Oropharynx moist, no lesions.  Neck - reepithelializing tissue appreciated, but skin still positive for moist desquamation.    Lab Findings: Lab Results  Component Value Date   WBC 4.9 10/14/2013   HGB 14.0 10/14/2013   HCT 41.6 10/14/2013   MCV 96.9 10/14/2013   PLT 179 10/14/2013    No results found for this basename: TSH   CMP     Component Value Date/Time   NA  135* 10/30/2013 0908   K 3.0* 10/30/2013 0908   CO2 31* 10/30/2013 0908   GLUCOSE 101 10/30/2013 0908   BUN 33.4* 10/30/2013 0908   CREATININE 1.9* 10/30/2013 0908   CALCIUM 9.5 10/30/2013 0908     Radiographic Findings: No results found.  Impression/Plan:  Still healing from RT.  Hard to quantify PO intake.  Labs and vitals imply dehydration, and micardis could also be affecting hypokalemia.  PLAN FOR MR. Rewis: 1) Refill of Hycet provided, as well as sillvadene provided 2) continue silvadene on skin 3) do not resume Micardis (blood pressure medication) until a Doctor tells you to do so 4) we will give you IV fluids today and tomorrow and reassess your labwork Friday. I will see you then. 5) Please swallow as much fluid as you can - especially ones with protein - ie nutritional shakes or whole milk - goal of at  least 12 cups/ daily 6) we will try to get our nutritionist to meet with you again. 7) call if any concerns [phone]  I spent 15 minutes face to face with the patient and more than 50% of that time was spent in counseling and/or coordination of care. _____________________________________   Eppie Gibson, MD

## 2013-10-30 NOTE — Addendum Note (Signed)
Encounter addended by: Deirdre Evener, RN on: 10/30/2013  6:36 PM<BR>     Documentation filed: Notes Section

## 2013-10-30 NOTE — Progress Notes (Signed)
Mr. Speagle received 1 liter of NS with 20 Meq of KCL, in the [facility],  since his Potassium level was 3.0 today.  He is scheduled for the same fluids on tomorrow and he was called and reminded that, per the written instructions on his AVS,  he is scheduled to have more fluids on tomorrow.  [facility] at ~6:27pm since they do not have voicemail.  Will contact in the morning to obtain a scheduled time for the infusion.

## 2013-10-30 NOTE — Procedures (Signed)
[facility]  Procedure Note  Nivin Braniff CWU:[id number] DOB: 1935-07-28 DOA: 10/30/2013   PCP: Eppie Gibson MD  Associated Diagnosis: Glottis carcinoma   Procedure Note: Infusion of 1 liter NS with 40 mEq Potassium    Condition During Procedure:  Tolerated well   Condition at Discharge:  Pt ambulatory; no complications noted   Tamala Julian, Salley Slaughter, [facility]

## 2013-10-31 ENCOUNTER — Encounter: Payer: Self-pay | Admitting: Radiation Oncology

## 2013-10-31 ENCOUNTER — Observation Stay (HOSPITAL_COMMUNITY)
Admission: AD | Admit: 2013-10-31 | Discharge: 2013-11-02 | Disposition: A | Discharge status: Home / Self Care | Payer: Medicare Other | Source: Ambulatory Visit | Attending: Family Medicine | Admitting: Family Medicine

## 2013-10-31 ENCOUNTER — Telehealth (HOSPITAL_COMMUNITY): Payer: Self-pay | Admitting: *Deleted

## 2013-10-31 DIAGNOSIS — C320 Malignant neoplasm of glottis: Secondary | ICD-10-CM | POA: Insufficient documentation

## 2013-10-31 DIAGNOSIS — L988 Other specified disorders of the skin and subcutaneous tissue: Secondary | ICD-10-CM | POA: Insufficient documentation

## 2013-10-31 DIAGNOSIS — C801 Malignant (primary) neoplasm, unspecified: Secondary | ICD-10-CM | POA: Diagnosis present

## 2013-10-31 DIAGNOSIS — E876 Hypokalemia: Principal | ICD-10-CM | POA: Insufficient documentation

## 2013-10-31 DIAGNOSIS — Y842 Radiological procedure and radiotherapy as the cause of abnormal reaction of the patient, or of later complication, without mention of misadventure at the time of the procedure: Secondary | ICD-10-CM | POA: Insufficient documentation

## 2013-10-31 DIAGNOSIS — I10 Essential (primary) hypertension: Secondary | ICD-10-CM | POA: Diagnosis present

## 2013-10-31 DIAGNOSIS — Z79899 Other long term (current) drug therapy: Secondary | ICD-10-CM | POA: Insufficient documentation

## 2013-10-31 DIAGNOSIS — Z87891 Personal history of nicotine dependence: Secondary | ICD-10-CM | POA: Insufficient documentation

## 2013-10-31 MED ORDER — SODIUM CHLORIDE 0.9 % IV SOLN
INTRAVENOUS | Status: AC
  Administered 2013-10-31: 10:00:00 via INTRAVENOUS
  Filled 2013-10-31: qty 1000

## 2013-10-31 NOTE — Procedures (Signed)
[facility]  Procedure Note  Blaire Hodsdon XKG:[id number] DOB: 10-23-1934 DOA: 10/31/2013   PCP: [name]  Associated Diagnosis: Glottis Carcinoma  Procedure Note: IV established, IV fluids given per order, IV deaccessed   Condition During Procedure: patient tolerated infusion without any complaints   Condition at [place] stable and ambulatory at discharge.   Roberto Scales, RN  [facility]

## 2013-10-31 NOTE — Telephone Encounter (Signed)
Called patient to schedule appointment for infusion. Patient states to come in for infusion today at approximately 9am for infusion.

## 2013-10-31 NOTE — Discharge Instructions (Signed)
Hypokalemia Hypokalemia means that the amount of potassium in the blood is lower than normal.Potassium is a chemical, called an electrolyte, that helps regulate the amount of fluid in the body. It also stimulates muscle contraction and helps nerves function properly.Most of the body's potassium is inside of cells, and only a very small amount is in the blood. Because the amount in the blood is so small, minor changes can be life-threatening. CAUSES  Antibiotics.  Diarrhea or vomiting.  Using laxatives too much, which can cause diarrhea.  Chronic kidney disease.  Water pills (diuretics).  Eating disorders (bulimia).  Low magnesium level.  Sweating a lot. SIGNS AND SYMPTOMS  Weakness.  Constipation.  Fatigue.  Muscle cramps.  Mental confusion.  Skipped heartbeats or irregular heartbeat (palpitations).  Tingling or numbness. DIAGNOSIS  Your health care provider can diagnose hypokalemia with blood tests. In addition to checking your potassium level, your health care provider may also check other lab tests. TREATMENT Hypokalemia can be treated with potassium supplements taken by mouth or adjustments in your current medicines. If your potassium level is very low, you may need to get potassium through a vein (IV) and be monitored in the hospital. A diet high in potassium is also helpful. Foods high in potassium are:  Nuts, such as peanuts and pistachios.  Seeds, such as sunflower seeds and pumpkin seeds.  Peas, lentils, and lima beans.  Whole grain and bran cereals and breads.  Fresh fruit and vegetables, such as apricots, avocado, bananas, cantaloupe, kiwi, oranges, tomatoes, asparagus, and potatoes.  Orange and tomato juices.  Red meats.  Fruit yogurt. HOME CARE INSTRUCTIONS  Take all medicines as prescribed by your health care provider.  Maintain a healthy diet by including nutritious food, such as fruits, vegetables, nuts, whole grains, and lean meats.  If  you are taking a laxative, be sure to follow the directions on the label. SEEK MEDICAL CARE IF:  Your weakness gets worse.  You feel your heart pounding or racing.  You are vomiting or having diarrhea.  You are diabetic and having trouble keeping your blood glucose in the normal range. SEEK IMMEDIATE MEDICAL CARE IF:  You have chest pain, shortness of breath, or dizziness.  You are vomiting or having diarrhea for more than 2 days.  You faint. MAKE SURE YOU:   Understand these instructions.  Will watch your condition.  Will get help right away if you are not doing well or get worse. Document Released: 07/18/2005 Document Revised: 05/08/2013 Document Reviewed: 01/18/2013 Exit[employer] Patient Information 2014 [employer].  

## 2013-11-01 ENCOUNTER — Encounter: Payer: Self-pay | Admitting: Radiation Oncology

## 2013-11-01 ENCOUNTER — Encounter (HOSPITAL_COMMUNITY): Payer: Self-pay | Admitting: *Deleted

## 2013-11-01 ENCOUNTER — Ambulatory Visit
Admission: RE | Admit: 2013-11-01 | Discharge: 2013-11-01 | Disposition: A | Discharge status: Home / Self Care | Payer: Medicare Other | Source: Ambulatory Visit | Attending: Radiation Oncology | Admitting: Radiation Oncology

## 2013-11-01 ENCOUNTER — Other Ambulatory Visit: Payer: Self-pay

## 2013-11-01 ENCOUNTER — Inpatient Hospital Stay (HOSPITAL_COMMUNITY): Admission: AD | Admit: 2013-11-01 | Payer: Medicare Other | Source: Ambulatory Visit | Admitting: Internal Medicine

## 2013-11-01 VITALS — BP 109/74 | HR 91 | Temp 98.8°F | Resp 20 | Wt 192.7 lb

## 2013-11-01 DIAGNOSIS — I10 Essential (primary) hypertension: Secondary | ICD-10-CM | POA: Diagnosis present

## 2013-11-01 DIAGNOSIS — C320 Malignant neoplasm of glottis: Secondary | ICD-10-CM

## 2013-11-01 LAB — BASIC METABOLIC PANEL (CC13)
Anion Gap: 12 mEq/L — ABNORMAL HIGH (ref 3–11)
BUN: 18.4 mg/dL (ref 7.0–26.0)
CHLORIDE: 98 meq/L (ref 98–109)
CO2: 26 mEq/L (ref 22–29)
Calcium: 9.3 mg/dL (ref 8.4–10.4)
Creatinine: 1.1 mg/dL (ref 0.7–1.3)
Glucose: 101 mg/dl (ref 70–140)
Potassium: 2.8 mEq/L — CL (ref 3.5–5.1)
Sodium: 135 mEq/L — ABNORMAL LOW (ref 136–145)

## 2013-11-01 LAB — PHOSPHORUS: Phosphorus: 2.1 mg/dL — ABNORMAL LOW (ref 2.3–4.6)

## 2013-11-01 LAB — MAGNESIUM: Magnesium: 2 mg/dL (ref 1.5–2.5)

## 2013-11-01 MED ORDER — SCOPOLAMINE 1 MG/3DAYS TD PT72
1.0000 | MEDICATED_PATCH | TRANSDERMAL | Status: DC
  Administered 2013-11-01: 1.5 mg via TRANSDERMAL
  Filled 2013-11-01: qty 1

## 2013-11-01 MED ORDER — PRUTECT EX EMUL
1.0000 "application " | Freq: Every day | CUTANEOUS | Status: DC
  Administered 2013-11-02: 1 via TOPICAL
  Filled 2013-11-01: qty 45

## 2013-11-01 MED ORDER — SODIUM CHLORIDE 0.9 % IV SOLN: 250.0000 mL | INTRAVENOUS | Status: DC | PRN

## 2013-11-01 MED ORDER — SODIUM CHLORIDE 0.9 % IJ SOLN: 3.0000 mL | Freq: Two times a day (BID) | INTRAMUSCULAR | Status: DC

## 2013-11-01 MED ORDER — HYDROCODONE-ACETAMINOPHEN 7.5-325 MG/15ML PO SOLN
10.0000 mL | ORAL | Status: DC | PRN
  Administered 2013-11-02: 15 mL via ORAL
  Filled 2013-11-01: qty 15

## 2013-11-01 MED ORDER — POTASSIUM CHLORIDE CRYS ER 20 MEQ PO TBCR
40.0000 meq | EXTENDED_RELEASE_TABLET | Freq: Once | ORAL | Status: AC
  Administered 2013-11-01: 40 meq via ORAL
  Filled 2013-11-01: qty 2

## 2013-11-01 MED ORDER — SILVER SULFADIAZINE 1 % EX CREA
TOPICAL_CREAM | Freq: Two times a day (BID) | CUTANEOUS | Status: DC
  Administered 2013-11-01 – 2013-11-02 (×2): 1 via TOPICAL
  Filled 2013-11-01 (×2): qty 85

## 2013-11-01 MED ORDER — GUAIFENESIN ER 600 MG PO TB12
600.0000 mg | ORAL_TABLET | Freq: Two times a day (BID) | ORAL | Status: DC | PRN
  Filled 2013-11-01: qty 1

## 2013-11-01 MED ORDER — SODIUM CHLORIDE 0.9 % IJ SOLN
3.0000 mL | Freq: Two times a day (BID) | INTRAMUSCULAR | Status: DC
Start: 2013-11-01 — End: 2013-11-02
  Administered 2013-11-01 – 2013-11-02 (×2): 3 mL via INTRAVENOUS

## 2013-11-01 MED ORDER — MAGIC MOUTHWASH W/LIDOCAINE
10.0000 mL | Freq: Four times a day (QID) | ORAL | Status: DC
  Administered 2013-11-01 – 2013-11-02 (×3): 10 mL via ORAL
  Filled 2013-11-01 (×6): qty 10

## 2013-11-01 MED ORDER — TAMSULOSIN HCL 0.4 MG PO CAPS
0.4000 mg | ORAL_CAPSULE | Freq: Every day | ORAL | Status: DC
  Administered 2013-11-02: 0.4 mg via ORAL
  Filled 2013-11-01 (×2): qty 1

## 2013-11-01 MED ORDER — POTASSIUM CHLORIDE 10 MEQ/100ML IV SOLN
10.0000 meq | INTRAVENOUS | Status: AC
  Administered 2013-11-01 (×2): 10 meq via INTRAVENOUS
  Filled 2013-11-01 (×2): qty 100

## 2013-11-01 MED ORDER — SODIUM CHLORIDE 0.9 % IJ SOLN: 3.0000 mL | INTRAMUSCULAR | Status: DC | PRN

## 2013-11-01 MED ORDER — DOCUSATE SODIUM 100 MG PO CAPS
100.0000 mg | ORAL_CAPSULE | Freq: Two times a day (BID) | ORAL | Status: DC
  Administered 2013-11-01 – 2013-11-02 (×2): 100 mg via ORAL
  Filled 2013-11-01 (×3): qty 1

## 2013-11-01 NOTE — H&P (Signed)
[facility] History and Physical  [name]:[id number] DOB: 1934/12/27 DOA: 10/31/2013  Referring physician: Dr. Eppie Gibson PCP: [name], MD   Chief Complaint: Hypokalemia  HPI: Derrick Ayala is a 79 y.o. 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 He received  1L NS with 40 meq KCL on 4-1 and 4-2 and returned today for lab work. He was found to have a potassium of 2.8. The patient denies any chest pain, shortness of breath, palpitations, nausea or vomiting. He does report a better PO intake and his weight is stabilizing. His throat is less sore and has been applying silvadene to his neck for radiation desquamation.  The patient was directly admitted after he was found to have hypokalemia during routine lab work.  As such he was referred to [place] for replacement of potassium, electrolyte management, and Telemetry monitoring.   Review of Systems:  Constitutional:  No weight loss, night sweats, Fevers, chills, fatigue.  HEENT: No headaches, Difficulty swallowing,Tooth/dental problems,Sore throat,  No sneezing, itching, ear ache, nasal congestion, post nasal drip,  Cardio-vascular:  No chest pain, Orthopnea, PND, swelling in lower extremities, anasarca, dizziness, palpitations  GI:  No heartburn, indigestion, abdominal pain, nausea, vomiting, diarrhea, change in bowel habits, loss of appetite  Resp:  + copious mucus secretions related to glottal radiation, at baseline per patient No shortness of breath with exertion or at rest. No productive cough, No non-productive cough, No coughing up of blood.No change in color of mucus.No wheezing.No chest wall deformity  Skin: + radiation desquamation of neck  no rash or lesions.  GU:  no dysuria, change in color of urine, no urgency or frequency. No flank pain.  Musculoskeletal:  No joint pain or swelling. No decreased  range of motion. No back pain.  Psych:  No change in mood or affect. No depression or anxiety. No memory loss.   Past Medical History  Diagnosis Date  . 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 Cancer 08/22/13 bx    left vocal cord,  . Hypertension   . S/P radiation therapy 10/24/13    Squmaous Cell Carcinoma of the left Glottis  . Hypokalemia    No past surgical history on file. Social History:  reports that he quit smoking about 14 years ago. He quit smokeless tobacco use about 8 weeks ago. His smokeless tobacco use included Chew. He reports that he drinks alcohol. His drug history is not on file.  No Known Allergies  Family History  Problem Relation Age of Onset  . Cancer Mother     unknown  . Cancer Brother     lung ca, smoker     Prior to Admission medications   Medication Sig Start Date End Date Taking? Authorizing Provider  Alum & Mag Hydroxide-Simeth (MAGIC MOUTHWASH W/LIDOCAINE) SOLN Take 10 mLs by mouth 4 (four) times daily. 1part nystatin, 1part benadryl, 2 parts 2% viscous lidocaine. Swish/swallow 41min before meals/bedtime. 09/23/13  Yes Eppie Gibson, MD  docusate sodium (COLACE) 100 MG capsule Take 1 capsule (100 mg total) by mouth 2 (two) times daily. To prevent constipation - take while on Hycet narcotic. 10/14/13  Yes Eppie Gibson, MD  emollient (BIAFINE) cream Apply 1 application topically daily. Apply bid after radiation and at bedtime to affected skin area on neck and on weekends  2 x day 09/16/13  Yes  Eppie Gibson, MD  guaiFENesin (MUCINEX) 600 MG 12 hr tablet Take 600 mg by mouth as needed for to loosen phlegm.    Yes Historical Provider, MD  HYDROcodone-acetaminophen (HYCET) 7.5-325 mg/15 ml solution Take 10-15 mLs by mouth every 4 (four) hours as needed for moderate pain. 10/30/13  Yes Eppie Gibson, MD  Pseudoephedrine HCl (SINUS & ALLERGY 12 HOUR PO) Take 1 tablet by mouth as needed (allergies).    Yes Historical Provider, MD    silver sulfADIAZINE (SILVADENE) 1 % cream Apply 1 application topically daily.   Yes Historical Provider, MD  tamsulosin (FLOMAX) 0.4 MG CAPS capsule Take 0.4 mg by mouth daily after breakfast.   Yes Historical Provider, MD  telmisartan-hydrochlorothiazide (MICARDIS HCT) 80-25 MG per tablet Take 1 tablet by mouth daily.   Yes Historical Provider, MD  scopolamine (TRANSDERM-SCOP) 1 MG/3DAYS Place 1 patch (1.5 mg total) onto the skin every 3 (three) days. 10/18/13   Eppie Gibson, MD   Physical Exam: Filed Vitals:   11/01/13 1324  BP: 120/75  Pulse: 73  Temp: 98.2 F (36.8 C)  Resp: 20    BP 120/75  Pulse 73  Temp(Src) 98.2 F (36.8 C) (Oral)  Resp 20  Ht 5\' 11"  (1.803 m)  Wt 84.414 kg (186 lb 1.6 oz)  BMI 25.97 kg/m2  SpO2 98%  General:  Appears calm and comfortable Eyes: PERRL, normal lids, irises & conjunctiva ENT: grossly normal hearing, lips & tongue Neck: no LAD, masses or thyromegaly Cardiovascular: RRR, no m/r/g. No LE edema. Telemetry: SR, no arrhythmias  Respiratory: CTA bilaterally, no w/r/r. Normal respiratory effort. Abdomen: soft, ntnd Skin:  + radiation desquamation to neck. no rash or induration seen on limited exam Musculoskeletal: grossly normal tone BUE/BLE Psychiatric: grossly normal mood and affect, speech fluent and appropriate Neurologic: no facial asymmetry. Grossly normal movement of all extremities, speech fluent and appropriate          Labs on Admission:  Basic Metabolic Panel:  Recent Labs Lab 10/30/13 0908 11/01/13 0918  NA 135* 135*  K 3.0* 2.8*  CO2 31* 26  GLUCOSE 101 101  BUN 33.4* 18.4  CREATININE 1.9* 1.1  CALCIUM 9.5 9.3   Liver Function Tests: No results found for this basename: AST, ALT, ALKPHOS, BILITOT, PROT, ALBUMIN,  in the last 168 hours No results found for this basename: LIPASE, AMYLASE,  in the last 168 hours No results found for this basename: AMMONIA,  in the last 168 hours CBC: No results found for this  basename: WBC, NEUTROABS, HGB, HCT, MCV, PLT,  in the last 168 hours Cardiac Enzymes: No results found for this basename: CKTOTAL, CKMB, CKMBINDEX, TROPONINI,  in the last 168 hours  BNP (last 3 results) No results found for this basename: PROBNP,  in the last 8760 hours CBG: No results found for this basename: GLUCAP,  in the last 168 hours  Radiological Exams on Admission: No results found.   Assessment/Plan Principal Problem:   Hypokalemia - Most likely 2ary to combination of K loss from hctz and poor oral intake within the last few days. He reports improvement in oral intake recently - Replace with 10 mEq doses x2 and 40 PO kdur - Recheck in AM - Check Mag and phos - monitor on tele - hold hydrochlorothiazide  Active Problems:    Glottis carcinoma - Per radiation oncology - continue silvadene and biafine for neck radiation desquamation - continue scopolamine, magic mouthwash, and mucinex    Unspecified essential hypertension -  Hold hydrochlorothiazide and monitor  Code Status: Full Family Communication: None at bedside. Pt updated on status Disposition Plan: 4/4 pending improvement   Time spent: > 60 min  Larwance Sachs [facility] Pager [phone]

## 2013-11-01 NOTE — Progress Notes (Signed)
Large area of radiation desquamation noted on upper chest, lower neck. Per Patric Dykes RN silvadene treatment done prior to admission today.

## 2013-11-01 NOTE — Progress Notes (Signed)
Dr Isidore Moos collaborated with Dr. Mortimer Fries since Mr. Hoback Potassium level continues to decline despite receiving 0.9 NS with 40 Meq on 10/30/13 and 10/30/13.  He also had received the same fluids with KCl on three occassions last week. It  was determined that he should be admitted to a telemetry bed for monitoring.  He will be admitted to room 1411.  Report given to Henrene Pastor, RN.  He is currently sitting in a recliner with no voiced concerns.

## 2013-11-01 NOTE — Progress Notes (Signed)
  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   Narrative:  The patient returns today for close  follow-up.  Received 1 L NS with 40 meq KCl on 4-1 and 4-2 and returns today for labs. Indicate normalizing of renal function but K+ is 2.8. He has held Micardis since Sunday. He reports better PO intake and frequent urination.  Eating fruits, shakes, ground [place].  Weight stabilizing. Throat less sore. Applying silvadene to skin from radiation desquamation.    ALLERGIES:  has No Known Allergies.  Meds:  No current outpatient prescriptions on file.   No current facility-administered medications for this encounter.    Physical Findings: The patient is in no acute distress. Patient is alert and oriented.  weight is 192 lb 11.2 oz (87.408 kg). His oral temperature is 98.8 F (37.1 C). His blood pressure is 109/74 and his pulse is 91. His respiration is 20 and oxygen saturation is 100%. . Neck - reepithelializing tissue appreciated, but skin still positive for moist desquamation   Lab Findings: Lab Results  Component Value Date   WBC 4.9 10/14/2013   HGB 14.0 10/14/2013   HCT 41.6 10/14/2013   MCV 96.9 10/14/2013   PLT 179 10/14/2013    No results found for this basename: TSH    Radiographic Findings: No results found.  Impression/Plan:    1) Head and Neck Cancer Status: still healing from RT.  Hypokalemia in spite of holding Micardis and getting KCl with IVF this week.  I spoke with Dr. Charlies Silvers who said pt may be admitted to Telemetry.  Patric Dykes, RN will Psychologist, prison and probation services to facilitate.  Pt amenable to this.  2) Nutritional Status: -  weight: stabilizing but at risk for further loss. - PEG tube: none  3) Risk Factors: The patient has been educated about risk factors including alcohol and tobacco abuse; they understand that avoidance of alcohol and tobacco is important to prevent recurrences as well as other cancers  4) Swallowing: still effortful, trouble with pills and solids. Has declined SLP evaluations  5) Dental: no irradiation of mouth occurred, only to laryngeal region.  6) Energy: will follow TSH  About 6 mo after RT completion  7) Social: No active social issues to address at this time  8) Other:  Not sure ambulatory nutritionist appt occurred yet; perhaps inpt nutritionist may see him. Pain control in throat has improved. Applying silvadene to skin from radiation desquamation.    9) Follow-up in ~1-2 wks. The patient was encouraged to call with any issues or questions before then.  I spent 25 minutes minutes face to face with the patient and more than 50% of that time was spent in counseling and/or coordination of care. _____________________________________   Eppie Gibson, MD

## 2013-11-01 NOTE — Progress Notes (Addendum)
follow up, labs  BMP today, Potassium today=2.8, BUN=18.4, CR=1.1 patient ortho static vitals taken, sitting b/p=134/80,P=68, RR=20, T=98.8, 100% room air sats, Standing=109/74,P=91, patient coughing up copius amounts clear sputum,  Not using scopolamine patch too exspensive stated patient, dry  And moist desquamation, using biafine  On the moist part of skin and silvadene cream  Around the edges where it is dry skin, stated patient, informed him to do the opposite, place silvadene on the moist area of skin, and biafine on edges of dry desquamation, , still has difficulty swallowing but takes without pain meds now, drinks ensure and slushies,  Ground up [place] and it went down okay states patient, no c/o nausea  10:06 AM

## 2013-11-01 NOTE — Progress Notes (Signed)
Derrick Ayala reports that he stopped taking Micardis 3 days ago as instructed by [name].  His potassium level continues to drop despite receiving IV hydration with 1 liter of NS with 40 Meq on 10/30/13 and 10/31/13.  His potassium level today is 2.8 as compared to a level of 3.0 on 10/30/13.

## 2013-11-01 NOTE — Addendum Note (Signed)
Encounter addended by: Deirdre Evener, RN on: 11/01/2013 12:39 PM<BR>     Documentation filed: Notes Section

## 2013-11-01 NOTE — Progress Notes (Signed)
Pt with excessive saliva production d/t radiation therapies. Suction set up at bedside and pt instructed on use to help with getting those secretions out, instead of spitting. Pt appreciated the set up and stated that he would use it if he needed it. Will continue to monitor.  Othella Boyer [facility] 11/01/2013 6:21 PM

## 2013-11-02 LAB — CBC
HEMATOCRIT: 29.9 % — AB (ref 39.0–52.0)
Hemoglobin: 10 g/dL — ABNORMAL LOW (ref 13.0–17.0)
MCH: 31.8 pg (ref 26.0–34.0)
MCHC: 33.4 g/dL (ref 30.0–36.0)
MCV: 95.2 fL (ref 78.0–100.0)
Platelets: 154 10*3/uL (ref 150–400)
RBC: 3.14 MIL/uL — AB (ref 4.22–5.81)
RDW: 12.6 % (ref 11.5–15.5)
WBC: 3.7 10*3/uL — AB (ref 4.0–10.5)

## 2013-11-02 LAB — BASIC METABOLIC PANEL
BUN: 15 mg/dL (ref 6–23)
CALCIUM: 9 mg/dL (ref 8.4–10.5)
CO2: 32 meq/L (ref 19–32)
CREATININE: 1.26 mg/dL (ref 0.50–1.35)
Chloride: 98 mEq/L (ref 96–112)
GFR calc Af Amer: 62 mL/min — ABNORMAL LOW (ref >90)
GFR calc non Af Amer: 54 mL/min — ABNORMAL LOW (ref >90)
GLUCOSE: 113 mg/dL — AB (ref 70–99)
Potassium: 3.4 mEq/L — ABNORMAL LOW (ref 3.7–5.3)
Sodium: 138 mEq/L (ref 137–147)

## 2013-11-02 MED ORDER — POTASSIUM CHLORIDE CRYS ER 20 MEQ PO TBCR
40.0000 meq | EXTENDED_RELEASE_TABLET | Freq: Once | ORAL | Status: AC
Start: 2013-11-02 — End: 2013-11-02
  Administered 2013-11-02: 40 meq via ORAL
  Filled 2013-11-02: qty 2

## 2013-11-02 NOTE — Discharge Summary (Signed)
Physician Discharge Summary  Nithin Demeo BWL:[id number] DOB: 10-29-1934 DOA: 10/31/2013  PCP: [name], MD  Admit date: 10/31/2013 Discharge date: 11/02/2013  Time spent: > 60 minutes  Recommendations for Outpatient Follow-up:  1. Follow up with radiation oncology on Monday for repeat potassium level and evaluation  2. Discontinued Micardis due to soft BP's and hypokalemia  Discharge Diagnoses:  Principal Problem:   Hypokalemia Active Problems:   Cancer   Glottis carcinoma   Unspecified essential hypertension   Discharge Condition: Stable, Improved  Diet recommendation: Regular  Filed Weights   11/01/13 1324 11/02/13 0429  Weight: 84.414 kg (186 lb 1.6 oz) 87.4 kg (192 lb 10.9 oz)    History of present illness:   Derrick Ayala is a 79 y.o. 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 He received 1L NS with 40 meq KCL on 4-1 and 4-2 and returned on 4/3 for lab work. He was found to have a potassium of 2.8. The patient denied any chest pain, shortness of breath, palpitations, nausea or vomiting. He did report a better PO intake and his weight stabilizing. His throat was less sore and had been applying silvadene to his neck for radiation desquamation.   The patient was directly admitted after he was found to have hypokalemia during routine lab work. As such he was referred to inpatient for replacement of potassium, electrolyte management, and Telemetry monitoring.   Hospital Course:   Principal Problem:   Hypokalemia  - Most likely 2ary to combination of K loss from hctz and recent poor oral intake.  - He reports gradual improvement in oral intake over last couple of days. - Replace with 10 mEq doses x2 and 40 PO kdur on 4/3 - AM labs on 4/4 K of 3.4, 40 Meq kdur PO given prior to discharge. - Mag 2.0 and phos 2.1 on 4/4  - monitored on tele  - hold  hydrochlorothiazide on discharge.  Active Problems:  Glottis carcinoma  - Managed per radiation oncology  - continued silvadene and biafine for neck radiation desquamation  - continued scopolamine, magic mouthwash, and mucinex   Unspecified essential hypertension  - Held MIcardis on d/c due to normal blood pressures on discharge day and hypokalemia associated medication side effect.   Procedures:  None  Consultations:  None  Discharge Exam: Filed Vitals:   11/02/13 0429  BP: 99/54  Pulse: 84  Temp: 98.2 F (36.8 C)  Resp: 18    General: Pt alert, awake, and oriented, NAD Cardiovascular: RRR, no MRG. No LE edema Respiratory: CTA bilaterally. No wheezes. Normal respiratory effort.  Discharge Instructions You were cared for by a hospitalist during your hospital stay. If you have any questions about your discharge medications or the care you received while you were in the hospital after you are discharged, you can call the unit and asked to speak with the hospitalist on call if the hospitalist that took care of you is not available. Once you are discharged, your primary care physician will handle any further medical issues. Please note that NO REFILLS for any discharge medications will be authorized once you are discharged, as it is imperative that you return to your primary care physician (or establish a relationship with a primary care physician if you do not have one) for your aftercare needs so that they can reassess your need for medications and monitor your lab values.  Discharge Orders   Future Appointments Provider Department Dept Phone   11/11/2013 8:00 AM [facility] [place] [facility] [phone]   11/11/2013 8:15 AM Eppie Gibson, MD [place] [facility] [phone]   Future Orders Complete By Expires   Call MD for:  extreme fatigue  As directed    Call MD for:  persistant nausea and vomiting  As directed     Call MD for:  temperature >100.4  As directed    Diet - low sodium heart healthy  As directed    Discharge instructions  As directed    Comments:     Follow up with your radiation oncologist on monday   Increase activity slowly  As directed        Medication List    STOP taking these medications       telmisartan-hydrochlorothiazide 80-25 MG per tablet  Commonly known as:  MICARDIS HCT      TAKE these medications       docusate sodium 100 MG capsule  Commonly known as:  COLACE  Take 1 capsule (100 mg total) by mouth 2 (two) times daily. To prevent constipation - take while on Hycet narcotic.     emollient cream  Commonly known as:  BIAFINE  Apply 1 application topically daily. Apply bid after radiation and at bedtime to affected skin area on neck and on weekends  2 x day     guaiFENesin 600 MG 12 hr tablet  Commonly known as:  MUCINEX  Take 600 mg by mouth as needed for to loosen phlegm.     HYDROcodone-acetaminophen 7.5-325 mg/15 ml solution  Commonly known as:  HYCET  Take 10-15 mLs by mouth every 4 (four) hours as needed for moderate pain.     magic mouthwash w/lidocaine Soln  Take 10 mLs by mouth 4 (four) times daily. 1part nystatin, 1part benadryl, 2 parts 2% viscous lidocaine. Swish/swallow 50min before meals/bedtime.     scopolamine 1 MG/3DAYS  Commonly known as:  TRANSDERM-SCOP  Place 1 patch (1.5 mg total) onto the skin every 3 (three) days.     silver sulfADIAZINE 1 % cream  Commonly known as:  SILVADENE  Apply 1 application topically daily.     SINUS & ALLERGY 12 HOUR PO  Take 1 tablet by mouth as needed (allergies).     tamsulosin 0.4 MG Caps capsule  Commonly known as:  FLOMAX  Take 0.4 mg by mouth daily after breakfast.       No Known Allergies    The results of significant diagnostics from this hospitalization (including imaging, microbiology, ancillary and laboratory) are listed below for reference.    Significant Diagnostic Studies: No  results found.  Microbiology: No results found for this or any previous visit (from the past 240 hour(s)).   Labs: Basic Metabolic Panel:  Recent Labs Lab 10/30/13 0908 11/01/13 0918 11/01/13 1432 11/02/13 0500  NA 135* 135*  --  138  K 3.0* 2.8*  --  3.4*  CL  --   --   --  98  CO2 31* 26  --  32  GLUCOSE 101 101  --  113*  BUN 33.4* 18.4  --  15  CREATININE 1.9* 1.1  --  1.26  CALCIUM 9.5 9.3  --  9.0  MG  --   --  2.0  --   PHOS  --   --  2.1*  --    Liver Function Tests: No  results found for this basename: AST, ALT, ALKPHOS, BILITOT, PROT, ALBUMIN,  in the last 168 hours No results found for this basename: LIPASE, AMYLASE,  in the last 168 hours No results found for this basename: AMMONIA,  in the last 168 hours CBC:  Recent Labs Lab 11/02/13 0500  WBC 3.7*  HGB 10.0*  HCT 29.9*  MCV 95.2  PLT 154   Cardiac Enzymes: No results found for this basename: CKTOTAL, CKMB, CKMBINDEX, TROPONINI,  in the last 168 hours BNP: BNP (last 3 results) No results found for this basename: PROBNP,  in the last 8760 hours CBG: No results found for this basename: GLUCAP,  in the last 168 hours     Signed:  Larwance Sachs  [facility] 11/02/2013, 9:37 AM

## 2013-11-04 NOTE — Care Management Note (Signed)
    Page 1 of 1   11/01/2013     3:33:27 PM   CARE MANAGEMENT NOTE 11/01/2013  Patient:  KAIRO, LAUBACHER   Account Number:  [id number]  Date Initiated:  11/01/2013  Documentation initiated by:  Dessa Phi  Subjective/Objective Assessment:   58 [address].NO:[id number] CELL CA L GLOTTIS.     Action/Plan:   FROM HOME W/SPOUSE.HAS PCP,ONCOLOGIST,PHARMACY.   Anticipated DC Date:  11/05/2013   Anticipated DC Plan:  [place]  CM consult      Choice offered to / List presented to:             Status of service:  In process, will continue to follow Medicare Important Message given?   (If response is "NO", the following Medicare IM given date fields will be blank) Date Medicare IM given:   Date Additional Medicare IM given:    Discharge Disposition:    Per UR Regulation:  Reviewed for med. necessity/level of care/duration of stay  If discussed at Long Length of Stay Meetings, dates discussed:    Comments:  11/01/13 Akeema Broder RN,BSN NCM [place].

## 2013-11-11 ENCOUNTER — Encounter: Payer: Self-pay | Admitting: *Deleted

## 2013-11-11 ENCOUNTER — Ambulatory Visit
Admit: 2013-11-11 | Discharge: 2013-11-11 | Disposition: A | Discharge status: Home / Self Care | Payer: Medicare Other | Attending: Radiation Oncology | Admitting: Radiation Oncology

## 2013-11-11 ENCOUNTER — Encounter (HOSPITAL_COMMUNITY): Payer: Self-pay | Admitting: Urology

## 2013-11-11 ENCOUNTER — Observation Stay (HOSPITAL_COMMUNITY)
Admission: AD | Admit: 2013-11-11 | Discharge: 2013-11-13 | Disposition: A | Discharge status: Home / Self Care | Payer: Medicare Other | Source: Ambulatory Visit | Attending: Family Medicine | Admitting: Family Medicine

## 2013-11-11 ENCOUNTER — Ambulatory Visit: Payer: Self-pay | Admitting: Radiation Oncology

## 2013-11-11 ENCOUNTER — Encounter: Payer: Self-pay | Admitting: Radiation Oncology

## 2013-11-11 ENCOUNTER — Ambulatory Visit
Admission: RE | Admit: 2013-11-11 | Discharge: 2013-11-11 | Disposition: A | Discharge status: Home / Self Care | Payer: Medicare Other | Source: Ambulatory Visit | Attending: Radiation Oncology | Admitting: Radiation Oncology

## 2013-11-11 VITALS — BP 123/64 | HR 79 | Temp 98.6°F | Resp 20 | Ht 71.0 in | Wt 192.4 lb

## 2013-11-11 DIAGNOSIS — C801 Malignant (primary) neoplasm, unspecified: Secondary | ICD-10-CM

## 2013-11-11 DIAGNOSIS — E876 Hypokalemia: Principal | ICD-10-CM

## 2013-11-11 DIAGNOSIS — I10 Essential (primary) hypertension: Secondary | ICD-10-CM

## 2013-11-11 DIAGNOSIS — C320 Malignant neoplasm of glottis: Secondary | ICD-10-CM

## 2013-11-11 DIAGNOSIS — Z923 Personal history of irradiation: Secondary | ICD-10-CM | POA: Insufficient documentation

## 2013-11-11 LAB — CBC
HEMATOCRIT: 28.1 % — AB (ref 39.0–52.0)
Hemoglobin: 9.5 g/dL — ABNORMAL LOW (ref 13.0–17.0)
MCH: 31.6 pg (ref 26.0–34.0)
MCHC: 33.8 g/dL (ref 30.0–36.0)
MCV: 93.4 fL (ref 78.0–100.0)
Platelets: 187 10*3/uL (ref 150–400)
RBC: 3.01 MIL/uL — ABNORMAL LOW (ref 4.22–5.81)
RDW: 12.8 % (ref 11.5–15.5)
WBC: 3.6 10*3/uL — ABNORMAL LOW (ref 4.0–10.5)

## 2013-11-11 LAB — BASIC METABOLIC PANEL
BUN: 8 mg/dL (ref 6–23)
CALCIUM: 8.9 mg/dL (ref 8.4–10.5)
CO2: 30 meq/L (ref 19–32)
CREATININE: 0.85 mg/dL (ref 0.50–1.35)
Chloride: 99 mEq/L (ref 96–112)
GFR calc Af Amer: >90 mL/min (ref >90)
GFR calc non Af Amer: 82 mL/min — ABNORMAL LOW (ref >90)
Glucose, Bld: 104 mg/dL — ABNORMAL HIGH (ref 70–99)
Potassium: 3.2 mEq/L — ABNORMAL LOW (ref 3.7–5.3)
Sodium: 137 mEq/L (ref 137–147)

## 2013-11-11 LAB — BASIC METABOLIC PANEL (CC13)
Anion Gap: 9 mEq/L (ref 3–11)
BUN: 8.1 mg/dL (ref 7.0–26.0)
CO2: 30 mEq/L — ABNORMAL HIGH (ref 22–29)
CREATININE: 0.9 mg/dL (ref 0.7–1.3)
Calcium: 9 mg/dL (ref 8.4–10.4)
Chloride: 99 mEq/L (ref 98–109)
Glucose: 96 mg/dl (ref 70–140)
Potassium: 2.6 mEq/L — CL (ref 3.5–5.1)
Sodium: 138 mEq/L (ref 136–145)

## 2013-11-11 LAB — CREATININE, SERUM
Creatinine, Ser: 0.86 mg/dL (ref 0.50–1.35)
GFR calc Af Amer: >90 mL/min (ref >90)
GFR, EST NON AFRICAN AMERICAN: 82 mL/min — AB (ref >90)

## 2013-11-11 MED ORDER — GUAIFENESIN ER 600 MG PO TB12
600.0000 mg | ORAL_TABLET | Freq: Two times a day (BID) | ORAL | Status: DC | PRN
  Administered 2013-11-12: 600 mg via ORAL
  Filled 2013-11-11: qty 1

## 2013-11-11 MED ORDER — ACETAMINOPHEN 325 MG PO TABS: 650.0000 mg | ORAL_TABLET | Freq: Four times a day (QID) | ORAL | Status: DC | PRN

## 2013-11-11 MED ORDER — SILVER SULFADIAZINE 1 % EX CREA
1.0000 "application " | TOPICAL_CREAM | Freq: Every day | CUTANEOUS | Status: DC
  Administered 2013-11-11 – 2013-11-12 (×2): 1 via TOPICAL
  Filled 2013-11-11: qty 85

## 2013-11-11 MED ORDER — MAGIC MOUTHWASH W/LIDOCAINE
10.0000 mL | Freq: Four times a day (QID) | ORAL | Status: DC
  Administered 2013-11-11 – 2013-11-13 (×9): 10 mL via ORAL
  Filled 2013-11-11 (×11): qty 10

## 2013-11-11 MED ORDER — HYDROCODONE-ACETAMINOPHEN 7.5-325 MG/15ML PO SOLN
10.0000 mL | ORAL | Status: DC | PRN
  Administered 2013-11-12: 15 mL via ORAL
  Filled 2013-11-11: qty 15

## 2013-11-11 MED ORDER — SODIUM CHLORIDE 0.9 % IV SOLN
INTRAVENOUS | Status: DC
  Administered 2013-11-11 – 2013-11-12 (×2): via INTRAVENOUS
  Filled 2013-11-11 (×3): qty 1000

## 2013-11-11 MED ORDER — ONDANSETRON HCL 4 MG/2ML IJ SOLN: 4.0000 mg | Freq: Four times a day (QID) | INTRAMUSCULAR | Status: DC | PRN

## 2013-11-11 MED ORDER — TAMSULOSIN HCL 0.4 MG PO CAPS
0.4000 mg | ORAL_CAPSULE | Freq: Every day | ORAL | Status: DC
  Administered 2013-11-12 – 2013-11-13 (×2): 0.4 mg via ORAL
  Filled 2013-11-11 (×3): qty 1

## 2013-11-11 MED ORDER — POTASSIUM CHLORIDE CRYS ER 20 MEQ PO TBCR
40.0000 meq | EXTENDED_RELEASE_TABLET | ORAL | Status: AC
  Administered 2013-11-11 (×2): 40 meq via ORAL
  Filled 2013-11-11 (×2): qty 2

## 2013-11-11 MED ORDER — DOCUSATE SODIUM 100 MG PO CAPS
100.0000 mg | ORAL_CAPSULE | Freq: Two times a day (BID) | ORAL | Status: DC
  Administered 2013-11-11 – 2013-11-13 (×5): 100 mg via ORAL
  Filled 2013-11-11 (×6): qty 1

## 2013-11-11 MED ORDER — ENOXAPARIN SODIUM 40 MG/0.4ML ~~LOC~~ SOLN
40.0000 mg | SUBCUTANEOUS | Status: DC
  Administered 2013-11-11 – 2013-11-12 (×2): 40 mg via SUBCUTANEOUS
  Filled 2013-11-11 (×3): qty 0.4

## 2013-11-11 NOTE — Progress Notes (Signed)
Follow up s/p radiation treatment left glottis completed 10/24/13  No c/o c/o pain,nausea, still has thick saliva, is eating soft foods, and drinks slushies , if he doesn't drink something for a while he gets dry mouth and acid reflus, not taking any pain meds, eating applesauce,  Creamed potatoes, cheescake, neck area dry desqaumation, using silvadene once day, has healed since last visit, labs done today, energy better 8:31 AM

## 2013-11-11 NOTE — H&P (Addendum)
[facility] History and Physical  Derrick Ayala ION:[id number] DOB: 03/02/1935 DOA: 11/11/2013  Referring physician: EDP PCP: [name], MD   Chief Complaint: low potassium, direct admit from [name]'s office  HPI: Derrick Ayala is a 79 y.o. male with PMH of Stage 2 Glottic carcinoma just completed XRT 3/26, HTN, was recently discharged from [facility] on 4/4 after overlying observation and treatment for hyperkalemia, his Micardis was stopped at the time. Patient reports doing well since discharge went to see [name] in followup today he had repeat labs drawn and was noted to have a potassium of 2.6 and hence referred for direct admission. He denies any vomiting or diarrhea, does report increased increased salivation which is somewhat chronic now. He has not taken anymore Micardis since discharge, denies taking any over-the-counter medications or herbal supplements. He denies weakness in his arms or legs   Review of Systems:  Constitutional:  No weight loss, night sweats, Fevers, chills, fatigue.  HEENT:  No headaches, Difficulty swallowing,Tooth/dental problems,Sore throat,  No sneezing, itching, ear ache, nasal congestion, post nasal drip,  Cardio-vascular:  No chest pain, Orthopnea, PND, swelling in lower extremities, anasarca, dizziness, palpitations  GI:  No heartburn, indigestion, abdominal pain, nausea, vomiting, diarrhea, change in bowel habits, loss of appetite  Resp:  No shortness of breath with exertion or at rest. No excess mucus, no productive cough, No non-productive cough, No coughing up of blood.No change in color of mucus.No wheezing.No chest wall deformity  Skin:  no rash or lesions.  GU:  no dysuria, change in color of urine, no urgency or frequency. No flank pain.  Musculoskeletal:  No joint pain or swelling. No decreased range of motion. No back pain.  Psych:  No change in mood or affect. No depression or anxiety. No memory loss.     Past Medical History  Diagnosis Date  . 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 Cancer 08/22/13 bx    left vocal cord,  . Hypertension   . S/P radiation therapy 10/24/13    Squmaous Cell Carcinoma of the left Glottis  . Hypokalemia    Past Surgical History  Procedure Laterality Date  . Hernia repair      12-13 yrs ago   Social History:  reports that he quit smoking about 14 years ago. He quit smokeless tobacco use about 2 months ago. His smokeless tobacco use included Chew. He reports that he drinks alcohol. His drug history is not on file.  No Known Allergies  Family History  Problem Relation Age of Onset  . Cancer Mother     unknown  . Cancer Brother     lung ca, smoker     Prior to Admission medications   Medication Sig Start Date End Date Taking? Authorizing Provider  Alum & Mag Hydroxide-Simeth (MAGIC MOUTHWASH W/LIDOCAINE) SOLN Take 10 mLs by mouth 4 (four) times daily. 1part nystatin, 1part benadryl, 2 parts 2% viscous lidocaine. Swish/swallow 53min before meals/bedtime. 09/23/13   Eppie Gibson, MD  docusate sodium (COLACE) 100 MG capsule Take 1 capsule (100 mg total) by mouth 2 (two) times daily. To prevent constipation - take while on Hycet narcotic. 10/14/13   Eppie Gibson, MD  emollient (BIAFINE) cream Apply 1 application topically daily. Apply bid after radiation and at bedtime to affected skin area on neck and on weekends  2 x day 09/16/13   Eppie Gibson, MD  guaiFENesin (MUCINEX) 600 MG 12 hr tablet Take  600 mg by mouth as needed for to loosen phlegm.     Historical Provider, MD  HYDROcodone-acetaminophen (HYCET) 7.5-325 mg/15 ml solution Take 10-15 mLs by mouth every 4 (four) hours as needed for moderate pain. 10/30/13   Eppie Gibson, MD  Pseudoephedrine HCl (SINUS & ALLERGY 12 HOUR PO) Take 1 tablet by mouth as needed (allergies).     Historical Provider, MD  silver sulfADIAZINE (SILVADENE) 1 % cream Apply 1 application topically daily.     Historical Provider, MD  tamsulosin (FLOMAX) 0.4 MG CAPS capsule Take 0.4 mg by mouth daily after breakfast.    Historical Provider, MD   Physical Exam: Filed Vitals:   11/11/13 1148  BP: 120/65  Pulse: 73  Temp: 98 F (36.7 C)  Resp: 18    BP 120/65  Pulse 73  Temp(Src) 98 F (36.7 C) (Oral)  Resp 18  Ht 5\' 10"  (1.778 m)  Wt 85.503 kg (188 lb 8 oz)  BMI 27.05 kg/m2  SpO2 100%  General:  Appears calm and comfortable, no distress Eyes: PERRL, normal lids, irises & conjunctiva ENT: grossly normal hearing, lips & tongue Neck: Desquamation noted secondary to local XRT Cardiovascular: RRR, no m/r/g. No LE edema. Respiratory: CTA bilaterally, no w/r/r. Normal respiratory effort. Abdomen: soft, NT, ND, Bs present Skin: sequelae of psoriasis, hypo-and hyperpigmented areas on lower legs Musculoskeletal: grossly normal tone BUE/BLE Psychiatric: grossly normal mood and affect, speech fluent and appropriate Neurologic: grossly non-focal.          Labs on Admission:  Basic Metabolic Panel:  Recent Labs Lab 11/11/13 0814  NA 138  K 2.6*  CO2 30*  GLUCOSE 96  BUN 8.1  CREATININE 0.9  CALCIUM 9.0   Liver Function Tests: No results found for this basename: AST, ALT, ALKPHOS, BILITOT, PROT, ALBUMIN,  in the last 168 hours No results found for this basename: LIPASE, AMYLASE,  in the last 168 hours No results found for this basename: AMMONIA,  in the last 168 hours CBC: No results found for this basename: WBC, NEUTROABS, HGB, HCT, MCV, PLT,  in the last 168 hours Cardiac Enzymes: No results found for this basename: CKTOTAL, CKMB, CKMBINDEX, TROPONINI,  in the last 168 hours  BNP (last 3 results) No results found for this basename: PROBNP,  in the last 8760 hours CBG: No results found for this basename: GLUCAP,  in the last 168 hours  Radiological Exams on Admission: No results found.  EKG: Independently reviewed. Pending  Assessment/Plan     Hypokalemia -Reportedly stopped Micardis (HCTZ) -Denies excessive GI losses although increased salivation and Salivary losses from his Glottic cancer could be contributing -Will check plasma renin activity and aldosterone concentration to evaluate for hypoaldosteronism -Also check 24-hour urine potassium to evaluate for excessive urine potassium wasting , RTA etc -replace K IV and PO -repeat in am -check Mag level -will need supplemental K at discharge    Glottis carcinoma -completed XRT -FU with [name]   Hypertension -stable, not on any meds -monitor  DVT proph: lovenox  Code Status: Full code  Family Communication: No family at bedside Disposition Plan: Home in 1 to 2 days  Time spent: 45 minutes  [facility] Pager [phone]

## 2013-11-11 NOTE — Progress Notes (Signed)
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   Narrative:  The patient returns today for routine follow-up. Minimal complaints. Persistent mucous in throat, but this is better.  Swallowing soft foods (bananas, applesauce, cheesecake, slushies, liquids, potatoes).  No pain. Energy better.  Not taking Micardis. Discharged on 4-4 after hypokalemia was addressed as an inpt.  ALLERGIES:  has No Known Allergies.  Meds: Current Outpatient Prescriptions  Medication Sig Dispense Refill  . Alum & Mag Hydroxide-Simeth (MAGIC MOUTHWASH W/LIDOCAINE) SOLN Take 10 mLs by mouth 4 (four) times daily. 1part nystatin, 1part benadryl, 2 parts 2% viscous lidocaine. Swish/swallow 63min before meals/bedtime.      . docusate sodium (COLACE) 100 MG capsule Take 1 capsule (100 mg total) by mouth 2 (two) times daily. To prevent constipation - take while on Hycet narcotic.  60 capsule  0  . emollient (BIAFINE) cream Apply 1 application topically daily. Apply bid after radiation and at bedtime to affected skin area on neck and on weekends  2 x day      . guaiFENesin (MUCINEX) 600 MG 12 hr tablet Take 600 mg by mouth as needed for to loosen phlegm.       . Pseudoephedrine HCl (SINUS & ALLERGY 12 HOUR PO) Take 1 tablet by mouth as needed (allergies).       . silver sulfADIAZINE (SILVADENE) 1 % cream Apply 1 application topically daily.      . tamsulosin (FLOMAX) 0.4 MG CAPS capsule Take 0.4 mg by mouth daily after breakfast.      . HYDROcodone-acetaminophen (HYCET) 7.5-325 mg/15 ml solution Take 10-15 mLs by mouth every 4 (four) hours as needed for moderate pain.  473 mL  0   No  current facility-administered medications for this encounter.    Physical Findings: The patient is in no acute distress. Patient is alert and oriented.  height is 5\' 11"  (1.803 m) and weight is 192 lb 6.4 oz (87.272 kg). His oral temperature is 98.6 F (37 C). His blood pressure is 123/64 and his pulse is 79. His respiration is 20 and oxygen saturation is 100%. .  Skin - dry desquamation, small scab, intact. Moist mucous membranes in oropharynx: no thrush or lesions.  Lab Findings: Lab Results  Component Value Date   WBC 3.7* 11/02/2013   HGB 10.0* 11/02/2013   HCT 29.9* 11/02/2013   MCV 95.2 11/02/2013   PLT 154 11/02/2013    No results found for this basename: TSH   CMP     Component Value Date/Time   NA 138 11/11/2013 0814   NA 138 11/02/2013 0500   K 2.6* 11/11/2013 0814   K 3.4* 11/02/2013 0500   CL 98 11/02/2013 0500   CO2 30* 11/11/2013 0814   CO2 32 11/02/2013 0500   GLUCOSE 96 11/11/2013 0814   GLUCOSE 113* 11/02/2013 0500   BUN 8.1 11/11/2013 0814   BUN 15 11/02/2013 0500   CREATININE 0.9 11/11/2013 0814   CREATININE 1.26 11/02/2013 0500   CALCIUM 9.0 11/11/2013 0814   CALCIUM 9.0 11/02/2013 0500   GFRNONAA 54* 11/02/2013 0500   GFRAA 62* 11/02/2013 0500     Radiographic  Findings: No results found.  Impression/Plan:    1) Head and Neck Cancer Status: Healing well from RT with good PO intake and minimal complaints. But, worsening Hypokalemia in spite of holding Micardis and getting potassium repleted as an inpatient 1.5 weeks ago.  I spoke with Dr. Broadus Lc who said pt may be admitted to Telemetry. I asked her to address acute problem and see what can be done to maintain safe K+ levels in future.  I explained that I do not think his K+ deficiency is related to the acute effects of RT any longer as his PO intake has improved significantly  2) Nutritional Status:  - weight: stable - PEG tube: none   3) Risk Factors: The patient has been educated about risk factors including alcohol and tobacco  abuse; they understand that avoidance of alcohol and tobacco is important to prevent recurrences as well as other cancers   4) Swallowing: still effortful, trouble with pills and solids. Has declined SLP evaluations ; continue soft diet  5) Dental: no irradiation of mouth occurred, only to laryngeal region.   6) Energy: will follow TSH About 6 mo after RT completion   7) Social: No active social issues to address at this time   8) Skin has healed well. Small scab, no moistness any longer.  Transition to  biafine in lieu of silvadene.   9) I will ask inpatient team to arrange patient to followup with his PCP with repeat labs to address his hypokalemia.  I will see him back in 6 wks to monitor his healing from RT.  I spent 20 minutes minutes face to face with the patient and more than 50% of that time was spent in counseling and/or coordination of care. _____________________________________   Eppie Gibson, MD

## 2013-11-12 ENCOUNTER — Encounter: Payer: Self-pay | Admitting: *Deleted

## 2013-11-12 LAB — BASIC METABOLIC PANEL
BUN: 6 mg/dL (ref 6–23)
CHLORIDE: 103 meq/L (ref 96–112)
CO2: 28 mEq/L (ref 19–32)
Calcium: 8.7 mg/dL (ref 8.4–10.5)
Creatinine, Ser: 0.9 mg/dL (ref 0.50–1.35)
GFR calc Af Amer: >90 mL/min (ref >90)
GFR calc non Af Amer: 80 mL/min — ABNORMAL LOW (ref >90)
Glucose, Bld: 103 mg/dL — ABNORMAL HIGH (ref 70–99)
Potassium: 3.5 mEq/L — ABNORMAL LOW (ref 3.7–5.3)
SODIUM: 140 meq/L (ref 137–147)

## 2013-11-12 LAB — URINALYSIS, ROUTINE W REFLEX MICROSCOPIC
GLUCOSE, UA: NEGATIVE mg/dL
Hgb urine dipstick: NEGATIVE
Ketones, ur: NEGATIVE mg/dL
LEUKOCYTES UA: NEGATIVE
Nitrite: NEGATIVE
PROTEIN: 30 mg/dL — AB
Specific Gravity, Urine: 1.025 (ref 1.005–1.030)
Urobilinogen, UA: 1 mg/dL (ref 0.0–1.0)
pH: 6 (ref 5.0–8.0)

## 2013-11-12 LAB — CBC
HEMATOCRIT: 26.6 % — AB (ref 39.0–52.0)
HEMOGLOBIN: 8.7 g/dL — AB (ref 13.0–17.0)
MCH: 31.5 pg (ref 26.0–34.0)
MCHC: 32.7 g/dL (ref 30.0–36.0)
MCV: 96.4 fL (ref 78.0–100.0)
Platelets: 164 10*3/uL (ref 150–400)
RBC: 2.76 MIL/uL — AB (ref 4.22–5.81)
RDW: 13.2 % (ref 11.5–15.5)
WBC: 3.3 10*3/uL — ABNORMAL LOW (ref 4.0–10.5)

## 2013-11-12 LAB — URINE MICROSCOPIC-ADD ON

## 2013-11-12 LAB — MAGNESIUM: MAGNESIUM: 1.7 mg/dL (ref 1.5–2.5)

## 2013-11-12 MED ORDER — ENSURE COMPLETE PO LIQD
237.0000 mL | Freq: Two times a day (BID) | ORAL | Status: DC
  Administered 2013-11-12 – 2013-11-13 (×3): 237 mL via ORAL

## 2013-11-12 MED ORDER — POTASSIUM CHLORIDE CRYS ER 20 MEQ PO TBCR
40.0000 meq | EXTENDED_RELEASE_TABLET | Freq: Every day | ORAL | Status: DC
  Administered 2013-11-12 – 2013-11-13 (×2): 40 meq via ORAL
  Filled 2013-11-12 (×2): qty 2

## 2013-11-12 NOTE — Progress Notes (Addendum)
[facility] PROGRESS NOTE  Garrell Flagg TML:[id number] DOB: 11-Dec-1934 DOA: 11/11/2013 PCP: [name], MD Brief Narrative Derrick Ayala is a 79 y.o. male with PMH of Stage 2 Glottic carcinoma just completed XRT 3/26, HTN, was recently discharged from [facility] on 4/4 after overlying observation and treatment for hyperkalemia, his Micardis was stopped at the time.  Patient reports doing well since discharge went to see [name] in followup today he had repeat labs drawn and was noted to have a potassium of 2.6 and hence referred for direct admission.  He denies any vomiting or diarrhea, does report increased increased salivation which is somewhat chronic now.  He has not taken anymore Micardis since discharge, denies taking any over-the-counter medications or herbal supplements.  He denies weakness in his arms or legs  Assessment/Plan: Hypokalemia  -Stopped Micardis (HCTZ) 1week back after hospitalization for hypokalemia -Denies excessive GI losses, although increased salivation and Salivary losses from his Glottic cancer could be contributing  -FU plasma renin activity and aldosterone concentration to evaluate for hyperaldosteronism  -FU check 24-hour urine potassium to evaluate for excessive urine potassium wasting , RTA etc  -replaced K IV and PO, stop IV KCL, change to KCL 40MEQ daily will need to be discharged home on 2meq or more -repeat in am  -Mag level normal  Glottis carcinoma  -completed XRT  -FU with [name]   Hypertension  -stable, not on any meds   DVT proph: lovenox  DC tele  Code Status: Full COde Family Communication: none at bedside Disposition Plan: home pending workup, home probably tomorrow  HPI/Subjective: Feels well, no complaints  Objective: Filed Vitals:   11/12/13 0457  BP: 115/71  Pulse: 77  Temp: 98.7 F (37.1 C)  Resp: 18    Intake/Output Summary (Last 24 hours) at 11/12/13 1042 Last data filed at 11/12/13  0900  Gross per 24 hour  Intake   2300 ml  Output    520 ml  Net   1780 ml   Filed Weights   11/11/13 1148  Weight: 85.503 kg (188 lb 8 oz)    Exam:   General: AAOx3  HEENT: discolored desquamative rash on neck  Cardiovascular: S1S2/RRR  Respiratory: CTAB  Abdomen: soft, Nt, BS present  Musculoskeletal:no edema c/c   Data Reviewed: Basic Metabolic Panel:  Recent Labs Lab 11/11/13 0814 11/11/13 1400 11/11/13 1650 11/12/13 0419  NA 138  --  137 140  K 2.6*  --  3.2* 3.5*  CL  --   --  99 103  CO2 30*  --  30 28  GLUCOSE 96  --  104* 103*  BUN 8.1  --  8 6  CREATININE 0.9 0.86 0.85 0.90  CALCIUM 9.0  --  8.9 8.7  MG  --   --   --  1.7   Liver Function Tests: No results found for this basename: AST, ALT, ALKPHOS, BILITOT, PROT, ALBUMIN,  in the last 168 hours No results found for this basename: LIPASE, AMYLASE,  in the last 168 hours No results found for this basename: AMMONIA,  in the last 168 hours CBC:  Recent Labs Lab 11/11/13 1400 11/12/13 0419  WBC 3.6* 3.3*  HGB 9.5* 8.7*  HCT 28.1* 26.6*  MCV 93.4 96.4  PLT 187 164   Cardiac Enzymes: No results found for this basename: CKTOTAL, CKMB, CKMBINDEX, TROPONINI,  in the last 168 hours BNP (last 3 results) No results found for this basename: PROBNP,  in the last 8760  hours CBG: No results found for this basename: GLUCAP,  in the last 168 hours  No results found for this or any previous visit (from the past 240 hour(s)).   Studies: No results found.  Scheduled Meds: . docusate sodium  100 mg Oral BID  . enoxaparin (LOVENOX) injection  40 mg Subcutaneous Q24H  . magic mouthwash w/lidocaine  10 mL Oral QID  . potassium chloride  40 mEq Oral Daily  . silver sulfADIAZINE  1 application Topical Daily  . tamsulosin  0.4 mg Oral QPC breakfast   Continuous Infusions:  Antibiotics Given (last 72 hours)   None      Principal Problem:   Hypokalemia Active Problems:   Glottis carcinoma    Unspecified essential hypertension    Time spent: 67min    [name]  [facility] Pager [phone]. If 7PM-7AM, please contact night-coverage at [email or web address], password [facility] 11/12/2013, 10:42 AM  LOS: 1 day

## 2013-11-12 NOTE — Progress Notes (Signed)
INITIAL NUTRITION ASSESSMENT  DOCUMENTATION CODES Per approved criteria  -Not Applicable   INTERVENTION: - Encouraged high calorie/protein foods and beverages which were discussed with pt. Pt will try to consume at least 1 serving of finely chopped meat/day and start adding eggs to his diet as tolerated.  - Encouraged outpatient [place] 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   Goal: Pt to consume >90% of meals/supplements  Monitor:  Weights, labs, intake  Reason for Assessment: Malnutrition screening tool   79 y.o. male  Admitting Dx: Hypokalemia  ASSESSMENT: Pt with hx of of Stage 2 Glottic carcinoma just completed XRT 3/26, HTN, was recently discharged from [facility] on 4/4 after overlying observation and treatment for hyperkalemia. Patient reports doing well since discharge went to see [name] in followup yesterday he had repeat labs drawn and was noted to have a potassium of 2.6 mEq/L and hence referred for direct admission. He denies any vomiting or diarrhea, does report increased increased salivation which is somewhat chronic now.  Pt has been seen by outpatient [facility] RD. Reports eating 4 times/day of foods/beverages such as applesauce, 1 Ensure, peanut butter, cheese, grape/orange slushie which pt drinks 2 cups of with every meal, creamed potatoes, and ground up [place]. Wakes up at 1-2am and eats. Reports his mucous production is getting better and denies any burning with swallowing. Pt reports being determined to eat well as he knows the importance of nutrition in recovery. Weight down 4 pounds from the beginning of the month.   Potassium improved with oral replacement  Nutrition Focused Physical  Exam:  Subcutaneous Fat:  Orbital Region: WNL Upper Arm Region: WNL Thoracic and Lumbar Region: NA  Muscle:  Temple Region: WNL Clavicle Bone Region: WNL Clavicle and Acromion Bone Region: WNL Scapular Bone Region: NA Dorsal Hand: WNL Patellar Region: WNL Anterior Thigh Region: WNL Posterior Calf Region: WNL  Edema: None noted     Height: Ht Readings from Last 1 Encounters:  11/11/13 5\' 10"  (1.778 m)    Weight: Wt Readings from Last 1 Encounters:  11/11/13 188 lb 8 oz (85.503 kg)    Ideal Body Weight: 166 lbs   % Ideal Body Weight: 113%  Wt Readings from Last 10 Encounters:  11/11/13 188 lb 8 oz (85.503 kg)  11/11/13 192 lb 6.4 oz (87.272 kg)  11/02/13 192 lb 10.9 oz (87.4 kg)  11/01/13 192 lb 11.2 oz (87.408 kg)  10/21/13 200 lb 9.6 oz (90.992 kg)  10/18/13 201 lb 4.8 oz (91.309 kg)  10/18/13 201 lb 9.6 oz (91.445 kg)  10/14/13 203 lb 11.2 oz (92.398 kg)  10/09/13 206 lb 8 oz (93.668 kg)  10/07/13 207 lb 9.6 oz (94.167 kg)    Usual Body Weight: 195 lbs per pt  % Usual Body Weight: 96%  BMI:  Body mass index is 27.05 kg/(m^2).  Estimated Nutritional Needs: Kcal: 1900-2100 Protein: 105-125g Fluid: 1.9-2.1L/day   Skin: Radiation treatment to neck  Diet Order: Criss Rosales  EDUCATION NEEDS: -Education needs addressed- briefly discussed high calorie/protein meals/snacks/beverages, declined wanting any handouts of information   Intake/Output Summary (Last 24 hours) at 11/12/13 1323 Last data filed at 11/12/13 0900  Gross per 24 hour  Intake   2060 ml  Output    520 ml  Net   1540 ml    Last BM: 4/13  Labs:   Recent Labs Lab 11/11/13 0814 11/11/13 1400 11/11/13 1650 11/12/13 0419  NA 138  --  137 140  K 2.6*  --  3.2* 3.5*  CL  --   --  99 103  CO2 30*  --  30 28  BUN 8.1  --  8 6  CREATININE 0.9 0.86 0.85 0.90  CALCIUM 9.0  --  8.9 8.7  MG  --   --   --  1.7  GLUCOSE 96  --  104* 103*    CBG (last 3)  No results found for this  basename: GLUCAP,  in the last 72 hours  Scheduled Meds: . docusate sodium  100 mg Oral BID  . enoxaparin (LOVENOX) injection  40 mg Subcutaneous Q24H  . magic mouthwash w/lidocaine  10 mL Oral QID  . potassium chloride  40 mEq Oral Daily  . silver sulfADIAZINE  1 application Topical Daily  . tamsulosin  0.4 mg Oral QPC breakfast    Continuous Infusions:   Past Medical History  Diagnosis Date  . 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 Cancer 08/22/13 bx    left vocal cord,  . Hypertension   . S/P radiation therapy 10/24/13    Squmaous Cell Carcinoma of the left Glottis  . Hypokalemia     Past Surgical History  Procedure Laterality Date  . Hernia repair      12-13 yrs ago    Mikey College MS, [place], [place] Pager [phone] After Hours Pager

## 2013-11-13 ENCOUNTER — Encounter: Payer: Self-pay | Admitting: *Deleted

## 2013-11-13 LAB — NA AND K (SODIUM & POTASSIUM), 24 H UR
POTASSIUM UR: 21 meq/L
Potassium, 24H Ur: 17 mEq/d — ABNORMAL LOW (ref 25–125)
SODIUM 24H UR: 42 meq/d (ref 40–220)
SODIUM UR: 53 meq/L
Urine Total Volume-UNAK24: 800 mL

## 2013-11-13 LAB — BASIC METABOLIC PANEL
BUN: 7 mg/dL (ref 6–23)
CO2: 31 mEq/L (ref 19–32)
Calcium: 8.7 mg/dL (ref 8.4–10.5)
Chloride: 104 mEq/L (ref 96–112)
Creatinine, Ser: 1.03 mg/dL (ref 0.50–1.35)
GFR calc non Af Amer: 68 mL/min — ABNORMAL LOW (ref >90)
GFR, EST AFRICAN AMERICAN: 79 mL/min — AB (ref >90)
Glucose, Bld: 102 mg/dL — ABNORMAL HIGH (ref 70–99)
POTASSIUM: 3.5 meq/L — AB (ref 3.7–5.3)
Sodium: 139 mEq/L (ref 137–147)

## 2013-11-13 MED ORDER — POTASSIUM CHLORIDE ER 20 MEQ PO TBCR: 40.0000 meq | EXTENDED_RELEASE_TABLET | Freq: Every day | ORAL | Status: DC

## 2013-11-13 NOTE — Discharge Summary (Signed)
Physician Discharge Summary  Derrick Ayala WJX:[id number] DOB: 11/06/1934 DOA: 11/11/2013  PCP: [name], MD  Admit date: 11/11/2013 Discharge date: 11/13/2013  Time spent: 35 minutes  Recommendations for Outpatient Follow-up:  1. Continue potassium 40 Meq daily until labwork is repeated  2. Follow Plasma renin activity levels to distinguish 1ry from 2ry aldosteronism and con sider Ct scan abdomen for furthe rwork up   Discharge Diagnoses:  Principal Problem:   Hypokalemia Active Problems:   Glottis carcinoma   Unspecified essential hypertension   Discharge Condition: fair  Diet recommendation: regular  Filed Weights   11/11/13 1148  Weight: 85.503 kg (188 lb 8 oz)    History of present illness:  79 y.o. male with PMH of Stage 2 Glottic carcinoma just completed XRT 3/26, HTN, was recently discharged from [name] on 4/4 after overlying observation and treatment for hyperkalemia, his Micardis was stopped at the time.  Patient reports doing well since discharge went to see [name] in followup today he had repeat labs drawn and was noted to have a potassium of 2.6 and hence referred for direct admission.  He denies any vomiting or diarrhea, does report increased increased salivation which is somewhat chronic now.  He has not taken anymore Micardis since discharge, denies taking any over-the-counter medications or herbal supplements.  He denies weakness in his arms or legs   Hospital Course:  Hypokalemia  -Stopped Micardis (HCTZ) 1week back after hospitalization for hypokalemia  -it is unlikely this was 2/2 to renal potassium losses as [name] was 17 -Denies excessive GI losses, although increased salivation and Salivary losses from his Glottic cancer could be contributing and recent diuretic -FU plasma renin activity and aldosterone concentration was pending at time of d/c -replaced K IV and PO, stop IV KCL, change to KCL 40MEQ daily will need to be  discharged home on 90meq or more  -Mag level normal  Glottis carcinoma  -completed XRT  -FU with [name]  Hypertension-actually hypotensive, likely negativng concerns for mineralocorticoid excess -stable, not on any meds    Discharge Exam: Filed Vitals:   11/13/13 1326  BP: 117/59  Pulse: 87  Temp: 98.6 F (37 C)  Resp: 18    General: eomi, ncat Cardiovascular:  s1 s2 no m/r/g Respiratory: clear  Discharge Instructions You were cared for by a hospitalist during your hospital stay. If you have any questions about your discharge medications or the care you received while you were in the hospital after you are discharged, you can call the unit and asked to speak with the hospitalist on call if the hospitalist that took care of you is not available. Once you are discharged, your primary care physician will handle any further medical issues. Please note that NO REFILLS for any discharge medications will be authorized once you are discharged, as it is imperative that you return to your primary care physician (or establish a relationship with a primary care physician if you do not have one) for your aftercare needs so that they can reassess your need for medications and monitor your lab values.  Discharge Orders   Future Appointments Provider Department Dept Phone   12/25/2013 9:30 AM Eppie Gibson, MD [facility] Radiation Oncology [phone]   Future Orders Complete By Expires   Diet - low sodium heart healthy  As directed    Discharge instructions  As directed    Increase activity slowly  As directed        Medication List  docusate sodium 100 MG capsule  Commonly known as:  COLACE  Take 1 capsule (100 mg total) by mouth 2 (two) times daily. To prevent constipation - take while on Hycet narcotic.     emollient cream  Commonly known as:  BIAFINE  Apply 1 application topically daily. Apply bid after radiation and at bedtime to affected skin area on neck  and on weekends  2 x day     guaiFENesin 600 MG 12 hr tablet  Commonly known as:  MUCINEX  Take 600 mg by mouth as needed for to loosen phlegm.     HYDROcodone-acetaminophen 7.5-325 mg/15 ml solution  Commonly known as:  HYCET  Take 10-15 mLs by mouth every 4 (four) hours as needed for moderate pain.     magic mouthwash w/lidocaine Soln  Take 10 mLs by mouth 4 (four) times daily. 1part nystatin, 1part benadryl, 2 parts 2% viscous lidocaine. Swish/swallow 18min before meals/bedtime.     Potassium Chloride ER 20 MEQ Tbcr  Take 40 mEq by mouth daily.     silver sulfADIAZINE 1 % cream  Commonly known as:  SILVADENE  Apply 1 application topically daily.     SINUS & ALLERGY 12 HOUR PO  Take 1 tablet by mouth as needed (allergies).     tamsulosin 0.4 MG Caps capsule  Commonly known as:  FLOMAX  Take 0.4 mg by mouth daily after breakfast.       No Known Allergies    The results of significant diagnostics from this hospitalization (including imaging, microbiology, ancillary and laboratory) are listed below for reference.    Significant Diagnostic Studies: No results found.  Microbiology: No results found for this or any previous visit (from the past 240 hour(s)).   Labs: Basic Metabolic Panel:  Recent Labs Lab 11/11/13 0814 11/11/13 1400 11/11/13 1650 11/12/13 0419 11/13/13 0334  NA 138  --  137 140 139  K 2.6*  --  3.2* 3.5* 3.5*  CL  --   --  99 103 104  CO2 30*  --  30 28 31   GLUCOSE 96  --  104* 103* 102*  BUN 8.1  --  8 6 7   CREATININE 0.9 0.86 0.85 0.90 1.03  CALCIUM 9.0  --  8.9 8.7 8.7  MG  --   --   --  1.7  --    Liver Function Tests: No results found for this basename: AST, ALT, ALKPHOS, BILITOT, PROT, ALBUMIN,  in the last 168 hours No results found for this basename: LIPASE, AMYLASE,  in the last 168 hours No results found for this basename: AMMONIA,  in the last 168 hours CBC:  Recent Labs Lab 11/11/13 1400 11/12/13 0419  WBC 3.6* 3.3*  HGB  9.5* 8.7*  HCT 28.1* 26.6*  MCV 93.4 96.4  PLT 187 164   Cardiac Enzymes: No results found for this basename: CKTOTAL, CKMB, CKMBINDEX, TROPONINI,  in the last 168 hours BNP: BNP (last 3 results) No results found for this basename: PROBNP,  in the last 8760 hours CBG: No results found for this basename: GLUCAP,  in the last 168 hours     Signed:  Nita Sells  [facility] 11/13/2013, 2:41 PM

## 2013-11-13 NOTE — Progress Notes (Signed)
UR completed 

## 2013-11-14 NOTE — Progress Notes (Signed)
Visited patient at [facility] to continue support.  He stated he expects to be DC'd tomorrow pending results of urine test.  Gayleen Orem, RN, BSN, [facility] Head & Neck Oncology Navigator [phone]

## 2013-11-14 NOTE — Progress Notes (Signed)
Visited patient to continue support during hospitalization.  Gayleen Orem, RN, BSN, [facility] Head & Neck Oncology Navigator [phone]

## 2013-11-14 NOTE — Progress Notes (Signed)
Visited patient on [facility] [id number] to provide support for his admission to address hypokalemia.  He expressed appreciation.  Gayleen Orem, RN, BSN, [facility] Head & Neck Oncology Navigator [phone]

## 2013-11-16 LAB — ALDOSTERONE + RENIN ACTIVITY W/ RATIO
ALDO / PRA Ratio: 0.3 Ratio — ABNORMAL LOW (ref 0.9–28.9)
ALDOSTERONE: 1 ng/dL
PRA LC/MS/MS: 3.31 ng/mL/h (ref 0.25–5.82)

## 2013-11-19 ENCOUNTER — Encounter: Payer: Self-pay | Admitting: Radiation Oncology

## 2013-12-04 ENCOUNTER — Telehealth: Payer: Self-pay | Admitting: *Deleted

## 2013-12-04 NOTE — Telephone Encounter (Signed)
Mr. Derrick Ayala and informed that [name] recommends he call his primary care physician in regards to the selling of his lower extremities which he thinks is the cause of his lower extremity edema.  He reports tht he did not take his Potassium today and note a decrease in his edema since this am.  Informed him that Gayleen Orem, Head/Neck RN Navigator will call him on tomorrow to coordinate his visit with his physician.  Encouraged him to call his PCP in the AM as weel.

## 2013-12-05 ENCOUNTER — Telehealth: Payer: Self-pay | Admitting: *Deleted

## 2013-12-05 NOTE — Telephone Encounter (Signed)
Pt called stating he has been experiencing BLE swelling since Friday of last week, it resolves with foot elevation.  He stated that he has not arranged for a follow-up apt with his PCP since his adx/DC for hypokalemia.  I encouraged him to contact his PCP to address swelling and follow-up labs to monitor K level.  Gayleen Orem, RN, BSN, [facility] Head & Neck Oncology Navigator [phone]

## 2013-12-05 NOTE — Telephone Encounter (Signed)
Contacted Development worker, community at [place] to facilitate appt with PCP.  Provided information re: patient's BLE swelling, April adx for hypokalemia, current Rx for potassium chloride, need for monitoring.  Scheduler [employer]) indicated patient could be seen today and wd contact him to arrange after our conversation.  Called patient later, learned from his wife that he has an appt for this afternoon.  Continuing to navigate as L3 (treatments completed) patient.  Gayleen Orem, RN, BSN, [facility] Head & Neck Oncology Navigator [phone]

## 2013-12-10 ENCOUNTER — Telehealth: Payer: Self-pay | Admitting: *Deleted

## 2013-12-10 NOTE — Telephone Encounter (Addendum)
Called patient to follow-up on his PCP appt last week.  He stated Dr. Inda Merlin informed him that K level is normal but he is anemic.  He has follow-up appt with Dr. Inda Merlin tomorrow, 12/11/13, to discuss.  He stated that Dr. Inda Merlin would call [name] with an update.  He reported that he started to take his BP medicine again on his own volition which has helped with the swelling in his feet.  He stated that Dr. Inda Merlin is aware.  Gayleen Orem, RN, BSN, [facility] Head & Neck Oncology Navigator [phone]

## 2013-12-18 ENCOUNTER — Encounter: Payer: Self-pay | Admitting: *Deleted

## 2013-12-25 ENCOUNTER — Ambulatory Visit
Admit: 2013-12-25 | Discharge: 2013-12-25 | Disposition: A | Discharge status: Home / Self Care | Payer: Medicare Other | Attending: Radiation Oncology | Admitting: Radiation Oncology

## 2013-12-25 ENCOUNTER — Encounter: Payer: Self-pay | Admitting: Radiation Oncology

## 2013-12-25 VITALS — BP 149/87 | HR 75 | Temp 99.0°F | Wt 205.2 lb

## 2013-12-25 DIAGNOSIS — C320 Malignant neoplasm of glottis: Secondary | ICD-10-CM

## 2013-12-25 NOTE — Progress Notes (Signed)
Derrick Ayala is here for follow-up appt.  Experiencing some swelling in legs/feet which resolves with elevation.  Continuing with some thick mucous which he easily controls.  Not using salt/bakin soda rinse.  Denies neck, swallowing pain.  Minimal skin discoloration on neck in laryngeal area.  Applying Biafine daily.  Eating and drinking with no difficulty, diet at baseline.  Energy level low in morning, back to baseline during day.

## 2013-12-25 NOTE — Progress Notes (Signed)
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   Narrative:  The patient returns today for routine follow-up.  Experiencing some swelling in legs/feet which resolves with elevation. Continuing with some thick mucous which he easily controls. Denies neck, swallowing pain.  Eating and drinking with no difficulty, diet at baseline. Energy level low in morning, back to baseline. Dr. Dorthy Cooler following him for history of low K+.  Appreciate labs from PCP on 12-06-13 - Kidney function and K+ looked good at that time.                              ALLERGIES:  has No Known Allergies.  Meds: Current Outpatient Prescriptions  Medication Sig Dispense Refill  . docusate sodium (COLACE) 100 MG capsule Take 1 capsule (100 mg total) by mouth 2 (two) times daily. To prevent constipation - take while on Hycet narcotic.  60 capsule  0  . emollient (BIAFINE) cream Apply 1 application topically daily. Apply bid after radiation and at bedtime to affected skin area on neck and on weekends  2 x day      . guaiFENesin (MUCINEX) 600 MG 12 hr tablet Take 600 mg by mouth as needed for to loosen phlegm.       . potassium chloride 20 MEQ TBCR Take 40 mEq by mouth daily.  60 tablet  0  . Pseudoephedrine HCl (SINUS & ALLERGY 12 HOUR PO) Take 1 tablet by mouth as needed (allergies).       . tamsulosin (FLOMAX) 0.4 MG CAPS capsule Take 0.4 mg by mouth daily after breakfast.      . Alum & Mag Hydroxide-Simeth (MAGIC MOUTHWASH W/LIDOCAINE) SOLN Take 10 mLs by mouth 4 (four) times daily. 1part nystatin, 1part benadryl, 2 parts 2% viscous lidocaine. Swish/swallow 28min before meals/bedtime.      Marland Kitchen  HYDROcodone-acetaminophen (HYCET) 7.5-325 mg/15 ml solution Take 10-15 mLs by mouth every 4 (four) hours as needed for moderate pain.  473 mL  0  . silver sulfADIAZINE (SILVADENE) 1 % cream Apply 1 application topically daily.       No current facility-administered medications for this encounter.    Physical Findings: The patient is in no acute distress. Patient is alert and oriented.  weight is 205 lb 3.2 oz (93.078 kg). His oral temperature is 99 F (37.2 C). His blood pressure is 149/87 and his pulse is 75. His oxygen saturation is 100%. .  Hypopigmentation of anterior neck skin in area of prior RT. Oropharynx clear.  Lab Findings: Lab Results  Component Value Date   WBC 3.3* 11/12/2013   HGB 8.7* 11/12/2013   HCT 26.6* 11/12/2013   MCV 96.4 11/12/2013   PLT 164 11/12/2013    No results found for this basename: TSH    Radiographic Findings: No results found.  Impression/Plan:    1) Head and Neck Cancer Status: healing well from RT; pt not bothered by skin changes. If pigmentation doesn't resolve to his satisfaction, dermatology referral is a consideration. But he is pleased with how he is healing.  2) Nutritional Status:no issues, eating well  3) Risk  Factors: The patient has been educated about risk factors including alcohol and tobacco abuse; they understand that avoidance of alcohol and tobacco is important to prevent recurrences as well as other cancers  4) Swallowing: no issues  5) Energy: improving, screen with TSH at next visit  6) Social: No active social issues to address at this time  7) Other: K+ levels stable, continue to follow w/ PCP  8) Follow-up in 5 months; we will refer him back to see Dr. Lucia Gaskins in 2 months so our appts can stagger. The patient was encouraged to call with any issues or questions before then.   ______________________   Eppie Gibson, MD

## 2013-12-26 ENCOUNTER — Telehealth: Payer: Self-pay | Admitting: *Deleted

## 2013-12-26 NOTE — Telephone Encounter (Signed)
Per Dr. Pearlie Oyster guidance, called Dr. Pollie Friar office with a request that they schedule Mr. Youtz for a follow-up around late July.  Receptionist verbalized understanding.  Gayleen Orem, RN, BSN, [facility] Head & Neck Oncology Navigator [phone]

## 2013-12-27 NOTE — Addendum Note (Signed)
Encounter addended by: Brooks Sailors, RN on: 12/27/2013  1:55 PM<BR>     Documentation filed: Visit Diagnoses, Notes Section

## 2013-12-27 NOTE — Progress Notes (Signed)
To provide support and care continuity, met with patient during follow-up appt with [name].  Derrick Ayala did not express any needs; I encouraged him to call me should that change.  He verbalized understanding.  Continuing to navigate as L3 (treatments completed) patient.  Gayleen Orem, RN, BSN, [facility] Head & Neck Oncology Navigator [phone]

## 2014-05-27 ENCOUNTER — Encounter: Payer: Self-pay | Admitting: Radiation Oncology

## 2014-05-28 ENCOUNTER — Ambulatory Visit
Admission: RE | Admit: 2014-05-28 | Discharge: 2014-05-28 | Disposition: A | Discharge status: Home / Self Care | Payer: Medicare Other | Source: Ambulatory Visit | Attending: Radiation Oncology | Admitting: Radiation Oncology

## 2014-05-28 ENCOUNTER — Encounter: Payer: Self-pay | Admitting: Radiation Oncology

## 2014-05-28 ENCOUNTER — Other Ambulatory Visit: Payer: Self-pay | Admitting: Radiation Oncology

## 2014-05-28 VITALS — BP 158/80 | HR 85 | Temp 98.6°F | Resp 20 | Ht 70.0 in | Wt 222.8 lb

## 2014-05-28 DIAGNOSIS — C320 Malignant neoplasm of glottis: Secondary | ICD-10-CM | POA: Diagnosis present

## 2014-05-28 DIAGNOSIS — Z79899 Other long term (current) drug therapy: Secondary | ICD-10-CM | POA: Insufficient documentation

## 2014-05-28 DIAGNOSIS — R5383 Other fatigue: Secondary | ICD-10-CM

## 2014-05-28 DIAGNOSIS — R5381 Other malaise: Secondary | ICD-10-CM

## 2014-05-28 DIAGNOSIS — R635 Abnormal weight gain: Secondary | ICD-10-CM

## 2014-05-28 DIAGNOSIS — R5382 Chronic fatigue, unspecified: Secondary | ICD-10-CM

## 2014-05-28 LAB — TSH CHCC: TSH: 2.356 m[IU]/L (ref 0.320–4.118)

## 2014-05-28 LAB — T4, FREE: Free T4: 1.12 ng/dL (ref 0.80–1.80)

## 2014-05-28 MED ORDER — LARYNGOSCOPY SOLUTION RAD-ONC
15.0000 mL | Freq: Once | TOPICAL | Status: AC
  Administered 2014-05-28: 15 mL via TOPICAL
  Filled 2014-05-28: qty 15

## 2014-05-28 NOTE — Progress Notes (Signed)
Derrick Ayala here for follow up after treatment to his glottis.  He denies pain, dry mouth, trouble swallowing and fatigue.  He reports that his saliva can be thick.  He reports being able to eat whatever he wants except for chips that cause him to cough.  He has gained 13 lbs since 12/23/13.  The skin on his neck is intact.

## 2014-05-28 NOTE — Progress Notes (Signed)
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   Narrative:  The patient returns today for routine follow-up.   He is here for follow up after treatment to his glottis. He denies pain, dry mouth, trouble swallowing. Occasion fatigue, but energy good today. He reports that his saliva can be thick, but this is improving. He reports being able to eat whatever he wants except for chips that cause him to cough. He has gained 13 lbs since 12/23/13. The skin on his neck is intact. He has not seen ENT recently.                     ALLERGIES:  has No Known Allergies.  Meds: Current Outpatient Prescriptions  Medication Sig Dispense Refill  . Pseudoephedrine HCl (SINUS & ALLERGY 12 HOUR PO) Take 1 tablet by mouth as needed (allergies).       . tamsulosin (FLOMAX) 0.4 MG CAPS capsule Take 0.4 mg by mouth daily after breakfast.      . valsartan (DIOVAN) 160 MG tablet Take 160 mg by mouth daily.      . Alum & Mag Hydroxide-Simeth (MAGIC MOUTHWASH W/LIDOCAINE) SOLN Take 10 mLs by mouth 4 (four) times daily. 1part nystatin, 1part benadryl, 2 parts 2% viscous lidocaine. Swish/swallow 35min before meals/bedtime.      . docusate sodium (COLACE) 100 MG capsule Take 1 capsule (100 mg total) by mouth 2 (two) times daily. To prevent constipation - take while on Hycet narcotic.  60 capsule  0  . emollient (BIAFINE) cream Apply 1 application topically daily. Apply bid after radiation and at bedtime to affected skin area on neck and on weekends  2 x day      . guaiFENesin (MUCINEX) 600 MG 12 hr tablet Take 600 mg by mouth as needed for to loosen phlegm.       Marland Kitchen  HYDROcodone-acetaminophen (HYCET) 7.5-325 mg/15 ml solution Take 10-15 mLs by mouth every 4 (four) hours as needed for moderate pain.  473 mL  0  . potassium chloride 20 MEQ TBCR Take 40 mEq by mouth daily.  60 tablet  0  . silver sulfADIAZINE (SILVADENE) 1 % cream Apply 1 application topically daily.       No current facility-administered medications for this encounter.    Physical Findings: The patient is in no acute distress. Patient is alert and oriented.  height is 5\' 10"  (1.778 m) and weight is 222 lb 12.8 oz (101.061 kg). His oral temperature is 98.6 F (37 C). His blood pressure is 158/80 and his pulse is 85. His respiration is 20 and oxygen saturation is 96%. .  Oropharyngeal mucosa is intact with no thrush or lesions. No palpable cervical or supraclavicular lymphadenopathy. Skin intact and smooth over neck.    PROCEDURE NOTE: After anesthetizing the nasal cavity with topical lidocaine and phenylephrine, the flexible endoscope was introduced and passed through the nasal cavity.  No laryngeal lesions appreciated.   Lab Findings: Lab Results  Component Value Date   WBC 3.3* 11/12/2013   HGB 8.7* 11/12/2013   HCT 26.6* 11/12/2013   MCV 96.4 11/12/2013  PLT 164 11/12/2013    Lab Results  Component Value Date   TSH 2.356 05/28/2014    Radiographic Findings: No results found.  Impression/Plan:    1) Head and Neck Cancer Status: NED  2) Nutritional Status: - weight: increasing - PEG tube: none  3) Risk Factors: The patient has been educated about risk factors including alcohol and tobacco abuse; they understand that avoidance of alcohol and tobacco is important to prevent recurrences as well as other cancers  4) Swallowing: good  5) Dental: Encouraged to continue regular followup with dentistry, and dental hygiene including fluoride rinses.   6) Thyroid function: good - normal TSH  7) Social: No active social issues to address at this time   8) Other: needs ENT f/u  in 3 mo.  I will ask Gayleen Orem, RN, our Head and Neck Oncology Navigator to arrange  9) Follow-up in 6 months with TSH. The patient was encouraged to call with any issues or questions before then.    _____________________________________   Eppie Gibson, MD

## 2014-05-29 ENCOUNTER — Telehealth: Payer: Self-pay | Admitting: *Deleted

## 2014-05-29 NOTE — Telephone Encounter (Signed)
Called Derrick Ayala and informed him that his TSH level is normal and that when he returns on 11/07/14 he will have lab drawn to re-check his Thyroid level.  He stated understanding.

## 2014-05-29 NOTE — Telephone Encounter (Signed)
CALLED PATIENT TO INFORM OF LAB AND FU VISIT FOR 11-05-14 (LABS ARE @ 10:45 AM), SPOKE WITH PATIENT'S WIFE AND SHE IS AWARE OF THESE APPTS.

## 2014-05-30 ENCOUNTER — Ambulatory Visit: Payer: Medicare Other

## 2014-05-30 ENCOUNTER — Ambulatory Visit: Payer: Medicare Other | Admitting: Radiation Oncology

## 2014-05-30 ENCOUNTER — Telehealth: Payer: Self-pay | Admitting: *Deleted

## 2014-05-30 NOTE — Addendum Note (Signed)
Encounter addended by: Deirdre Evener, RN on: 05/30/2014  4:18 PM<BR>     Documentation filed: Charges VN

## 2014-05-30 NOTE — Telephone Encounter (Signed)
Spoke with Gay Filler at Dr. Pollie Friar office, requested that a 3 month f/u appt be scheduled for patient.  She verbalized understanding.  Gayleen Orem, RN, BSN, [place] at [place] [phone]

## 2014-11-05 ENCOUNTER — Telehealth: Payer: Self-pay | Admitting: *Deleted

## 2014-11-05 ENCOUNTER — Ambulatory Visit (HOSPITAL_BASED_OUTPATIENT_CLINIC_OR_DEPARTMENT_OTHER)
Admission: RE | Admit: 2014-11-05 | Discharge: 2014-11-05 | Disposition: A | Discharge status: Home / Self Care | Payer: Medicare Other | Source: Ambulatory Visit | Attending: Radiation Oncology | Admitting: Radiation Oncology

## 2014-11-05 ENCOUNTER — Ambulatory Visit
Admission: RE | Admit: 2014-11-05 | Discharge: 2014-11-05 | Disposition: A | Discharge status: Home / Self Care | Payer: Medicare Other | Source: Ambulatory Visit | Attending: Radiation Oncology | Admitting: Radiation Oncology

## 2014-11-05 ENCOUNTER — Encounter: Payer: Self-pay | Admitting: Radiation Oncology

## 2014-11-05 VITALS — BP 150/81 | HR 95 | Temp 98.4°F | Resp 20 | Wt 230.5 lb

## 2014-11-05 DIAGNOSIS — C320 Malignant neoplasm of glottis: Secondary | ICD-10-CM | POA: Diagnosis not present

## 2014-11-05 DIAGNOSIS — Z87891 Personal history of nicotine dependence: Secondary | ICD-10-CM | POA: Insufficient documentation

## 2014-11-05 DIAGNOSIS — R635 Abnormal weight gain: Secondary | ICD-10-CM

## 2014-11-05 LAB — TSH CHCC: TSH: 2.173 m(IU)/L (ref 0.320–4.118)

## 2014-11-05 MED ORDER — LARYNGOSCOPY SOLUTION RAD-ONC
15.0000 mL | Freq: Once | TOPICAL | Status: AC
Start: 2014-11-05 — End: 2014-11-05
  Administered 2014-11-05: 15 mL via TOPICAL
  Filled 2014-11-05: qty 15

## 2014-11-05 NOTE — Telephone Encounter (Signed)
Spoke with Marlowe Kays at Dr. Pollie Friar office, requested 4 month f/u appt for patient.  She indicated she would arrange.  Gayleen Orem, RN, BSN, [place] at [place] [phone]

## 2014-11-05 NOTE — Progress Notes (Signed)
Laryngoscope picture 3

## 2014-11-05 NOTE — Progress Notes (Signed)
Pain Status: none  Weight changes, if any: gained 8 lbs since Oct 2015  Nutritional Status a) intake: regular diet b) using a feeding tube?: na c) weight changes, if any:   Swallowing Status: normal, no difficulty eating/swallowing  Dental (if applicable): When was last visit with dentistry  Saw dentist through [facility] for filling,  Using fluoride trays daily? na   When was last ENT visit?   When is next ENT visit? Was 'no show' for Dr Lucia Gaskins 08/14/14, no appt scheduled  Other notable issues, if any: doing well BP 150/81 mmHg  Pulse 95  Temp(Src) 98.4 F (36.9 C) (Oral)  Resp 20  Wt 230 lb 8 oz (104.554 kg)

## 2014-11-05 NOTE — Progress Notes (Signed)
Laryngoscope picture - 2

## 2014-11-05 NOTE — Telephone Encounter (Signed)
CALLED PATIENT TO INFORM OF LAB ON 07/08/15 @ 11 AM, LVM FOR A RETURN CALL

## 2014-11-05 NOTE — Progress Notes (Signed)
  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   Narrative:  The patient returns today for routine follow-up.   Pain Status: none  Weight changes, if any: gained 8 lbs since Oct 2015  Nutritional Status a) intake: regular diet b) using a feeding tube?: na   Swallowing Status: normal, no difficulty eating/swallowing   When was last ENT visit?   When is next ENT visit? Was 'no show' for Dr Lucia Gaskins 08/14/14, no appt scheduled  Other notable issues, if any: doing well  BP 150/81 mmHg  Pulse 95  Temp(Src) 98.4 F (36.9 C) (Oral)  Resp 20  Wt 230 lb 8 oz (104.554 kg)                   ALLERGIES:  has No Known Allergies.  Meds: Current Outpatient Prescriptions  Medication Sig Dispense Refill  . Pseudoephedrine HCl (SINUS & ALLERGY 12 HOUR PO) Take 1 tablet by mouth as needed (allergies).     . tamsulosin (FLOMAX) 0.4 MG CAPS capsule Take 0.4 mg by mouth daily after breakfast.    . valsartan (DIOVAN) 160 MG tablet Take 160 mg by mouth daily.     No current facility-administered medications for this encounter.    Physical Findings: The patient is in no acute distress. Patient is alert and oriented.  weight is 230 lb 8 oz (104.554 kg). His oral temperature is 98.4 F (36.9 C). His blood pressure is 150/81 and his pulse is 95. His respiration is 20. Marland Kitchen  Oropharyngeal mucosa is intact with no thrush or lesions. No palpable cervical or supraclavicular lymphadenopathy. Skin intact and smooth over neck.  Heart RRR, Chest CTAB  PROCEDURE NOTE: After anesthetizing the nasal cavity with topical lidocaine and phenylephrine, the flexible endoscope  was introduced and passed through the nasal cavity.  No laryngeal lesions appreciated.   Lab Findings: Lab Results  Component Value Date   WBC 3.3* 11/12/2013   HGB 8.7* 11/12/2013   HCT 26.6* 11/12/2013   MCV 96.4 11/12/2013   PLT 164 11/12/2013    Lab Results  Component Value Date   TSH 2.173 11/05/2014    Radiographic Findings: No results found.  Impression/Plan:    1) Head and Neck Cancer Status: NED  2) Nutritional Status: - weight: increasing   3) Risk Factors: The patient has been educated about risk factors including alcohol and tobacco abuse; they understand that avoidance of alcohol and tobacco is important to prevent recurrences as well as other cancers. He is not using tobacco.  4) Swallowing: good  5) Thyroid function: good - normal TSH  6) Social: No active social issues to address at this time   7) Other: needs ENT f/u in 4 mo.  I will ask Gayleen Orem, RN, our Head and Neck Oncology Navigator to arrange  9) Follow-up in 8 months with TSH. The patient was encouraged to call with any issues or questions before then.    _____________________________________   Eppie Gibson, MD

## 2014-11-05 NOTE — Progress Notes (Signed)
  Laryngoscope Pictures

## 2014-12-01 ENCOUNTER — Telehealth: Payer: Self-pay | Admitting: *Deleted

## 2014-12-01 NOTE — Telephone Encounter (Signed)
Patient called, requested information re: type of cancer.  Gayleen Orem, RN, BSN, [place] at [place] [phone]

## 2015-07-07 NOTE — Progress Notes (Addendum)
    Pain issues, if any: None Using a feeding tube?: None Weight changes, if any: Gained 11 lbs since April 2016 Swallowing issues, if any: Difficulty eating foods such as potatoe chips. Intermittent periods of mucus build up in his throat Smoking or chewing tobacco? None since diagnosis Using fluoride trays daily? yes Last ENT visit was on:  Dr. Radene Journey 11/13/14. I called his office 07/06/15 and asked for the transcript to be faxed.  Other notable issues, if any:   TSH drawn today at 11:04am  BP 125/70 mmHg  Pulse 83  Temp(Src) 98.5 F (36.9 C)  Ht 5\' 10"  (1.778 m)  Wt 241 lb (109.317 kg)  BMI 34.58 kg/m2  SpO2 100%

## 2015-07-08 ENCOUNTER — Ambulatory Visit
Admission: RE | Admit: 2015-07-08 | Discharge: 2015-07-08 | Disposition: A | Discharge status: Home / Self Care | Payer: Medicare Other | Source: Ambulatory Visit | Attending: Radiation Oncology | Admitting: Radiation Oncology

## 2015-07-08 ENCOUNTER — Encounter: Payer: Self-pay | Admitting: *Deleted

## 2015-07-08 ENCOUNTER — Encounter: Payer: Self-pay | Admitting: Radiation Oncology

## 2015-07-08 ENCOUNTER — Ambulatory Visit (HOSPITAL_BASED_OUTPATIENT_CLINIC_OR_DEPARTMENT_OTHER)
Admission: RE | Admit: 2015-07-08 | Discharge: 2015-07-08 | Disposition: A | Discharge status: Home / Self Care | Payer: Medicare Other | Source: Ambulatory Visit | Attending: Radiation Oncology | Admitting: Radiation Oncology

## 2015-07-08 ENCOUNTER — Encounter: Payer: Self-pay | Admitting: Adult Health

## 2015-07-08 VITALS — BP 125/70 | HR 83 | Temp 98.5°F | Ht 70.0 in | Wt 241.0 lb

## 2015-07-08 DIAGNOSIS — C320 Malignant neoplasm of glottis: Secondary | ICD-10-CM | POA: Diagnosis not present

## 2015-07-08 DIAGNOSIS — R635 Abnormal weight gain: Secondary | ICD-10-CM

## 2015-07-08 LAB — TSH: TSH: 2.648 m(IU)/L (ref 0.320–4.118)

## 2015-07-08 MED ORDER — LARYNGOSCOPY SOLUTION RAD-ONC
15.0000 mL | Freq: Once | TOPICAL | Status: AC
  Administered 2015-07-08: 15 mL via TOPICAL
  Filled 2015-07-08: qty 15

## 2015-07-08 NOTE — Progress Notes (Signed)
  Oncology Nurse Navigator Documentation   Navigator Encounter Type: Clinic/MDC;Other (96-monthfollow-up with laryngoscopy) (07/08/15 1120) Patient Visit Type: Radonc (07/08/15 1120)     Met with patient during 8-m post-tmt follow-up with Dr. SIsidore Moos  He reported minor difficulty eating "sharp" foods eg potato chips, occasional increased mucous.  Otherwise he reports doing well, "gaining weight".  I assisted with laryngoscopy.  He accepted offer of Survivorship support with NP GMike Craze understands she will contact him to arrange initial appt. He did not express any needs or concerns at this time, I encouraged him to contact me if that changes, he verbalized understanding.  RGayleen Orem RN, BSN, [phone]                  Time Spent with Patient: 45 (07/08/15 1120)

## 2015-07-08 NOTE — Progress Notes (Signed)
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   Narrative:  The patient returns today for routine follow-up.      Pain issues, if any: None Using a feeding tube?: None Weight changes, if any: Gained 11 lbs since April 2016 Swallowing issues, if any: Difficulty eating foods such as potato chips. Intermittent periods of mucus build up in his throat Smoking or chewing tobacco? None since diagnosis Using fluoride trays daily? yes Last ENT visit was on:  Dr. Radene Journey 11/13/14.   TSH drawn today at 11:04am  BP 125/70 mmHg  Pulse 83  Temp(Src) 98.5 F (36.9 C)  Ht 5' 10"  (1.778 m)  Wt 241 lb (109.317 kg)  BMI 34.58 kg/m2  SpO2 100%    BP 125/70 mmHg  Pulse 83  Temp(Src) 98.5 F (36.9 C)  Ht 5' 10"  (1.778 m)  Wt 241 lb (109.317 kg)  BMI 34.58 kg/m2  SpO2 100%                   ALLERGIES:  has No Known Allergies.  Meds: Current Outpatient Prescriptions  Medication Sig Dispense Refill  . amLODipine (NORVASC) 10 MG tablet Take 10 mg by mouth daily.    . Pseudoephedrine HCl (SINUS & ALLERGY 12 HOUR PO) Take 1 tablet by mouth as needed (allergies).     . tamsulosin (FLOMAX) 0.4 MG CAPS capsule Take 0.4 mg by mouth daily after breakfast.    . valsartan (DIOVAN) 160 MG tablet Take 320 mg by mouth daily.      No current facility-administered medications for this encounter.    Physical Findings: The patient is in no acute distress. Patient is alert and oriented.  height is 5' 10"  (1.778 m) and weight is 241 lb (109.317 kg). His temperature is 98.5 F (36.9 C). His blood pressure is 125/70 and his pulse is 83. His oxygen saturation  is 100%. .  Oropharyngeal mucosa is intact with no thrush or lesions. No palpable cervical or supraclavicular lymphadenopathy. Skin intact and smooth over neck.  Heart RRR, Chest CTAB  PROCEDURE NOTE: After anesthetizing the nasal cavity with topical lidocaine and phenylephrine, the flexible endoscope was introduced and passed through the nasal cavity.  No pharyngeal or laryngeal lesions appreciated. True cords are symmetrically mobile.   Lab Findings: Lab Results  Component Value Date   WBC 3.3* 11/12/2013   HGB 8.7* 11/12/2013   HCT 26.6* 11/12/2013   MCV 96.4 11/12/2013   PLT 164 11/12/2013    Lab Results  Component Value Date   TSH 2.648 07/08/2015    Radiographic Findings: No results found.  Impression/Plan:    1) Head and Neck Cancer Status: NED  2) Nutritional Status: no issues   3) Risk Factors: The patient has been educated about risk factors including alcohol and tobacco abuse; they understand that avoidance of alcohol and tobacco is important to prevent recurrences as well as other cancers. He is not using tobacco.  4) Swallowing: good  5) Thyroid function: good - normal TSH  6) Social: No active social issues to address at this time   7) Other: needs ENT f/u  in 4 mo.  I will ask Gayleen Orem, RN, our Head and Neck Oncology Navigator to arrange He'll see Mike Craze NP of survivorship in 8 months to go over a survivorship plan and undergo a physical exam/ laryngoscopy as indicated.  She will then arrange f/u with me in about 4 more months with TSH.  Pt met Elzie Rings today and is agreeable to that plan.   The patient was encouraged to call with any issues or questions before then.    _____________________________________   Eppie Gibson, MD

## 2015-07-09 ENCOUNTER — Telehealth: Payer: Self-pay | Admitting: *Deleted

## 2015-07-09 NOTE — Progress Notes (Signed)
I briefly met Mr. Derrick Ayala today during his radiation oncology visit with [name]. Mr. Derrick Ayala completed radiation therapy for cancer of the glottis on 10/24/13 and is clinically without evidence of disease today. [name] spoke with the patient about having subsequent visits alternating with myself and [name] for continued surveillance and follow-up care. Mr. Derrick Ayala agreed. I discussed with him the role of survivorship and my role in his post-treatment cancer care. I gave him a copy of the "Life After Cancer for Every Survivor" booklet, along with my business card and encouraged him to call me with any questions or concerns.   I will see him in 8 months with laryngoscopy. He was encouraged to see his ENT physician. Dr. Lucia Gaskins, in 4 months.  Mr. Derrick Ayala survivorship/surveillance appointment with me was scheduled and a letter has been mailed to his home, along with an appt calendar.  I encouraged him to call me with any concerns and we could certainly see him sooner, if needed. I look forward to participating in his care.   Mike Craze, NP [place] [phone]

## 2015-07-09 NOTE — Telephone Encounter (Signed)
  Oncology Nurse Navigator Documentation   Navigator Encounter Type: Telephone (07/09/15 0932)         Interventions: Coordination of Care (07/09/15 0932)     Per Dr. Pearlie Oyster guidance, called ENT Dr. Pollie Friar office to arrange follow-up appt.   Spoke with RN Susie, requested that patient be contacted and appt arranged to see Dr. Lucia Gaskins in    4 months.  She verbalized understanding.  Gayleen Orem, RN, BSN, [place] at [place] [phone]            Time Spent with Patient: 15 (07/09/15 0932)

## 2015-10-26 ENCOUNTER — Ambulatory Visit: Payer: Medicare Other | Admitting: Cardiology

## 2015-10-29 ENCOUNTER — Encounter: Payer: Self-pay | Admitting: Cardiology

## 2016-03-02 ENCOUNTER — Telehealth: Payer: Self-pay | Admitting: Adult Health

## 2016-03-02 NOTE — Telephone Encounter (Signed)
Spoke with pt wife to confirm r/s long term survivorship appt to 8/28 at 1 pm per GD

## 2016-03-11 ENCOUNTER — Encounter: Payer: Medicare Other | Admitting: Adult Health

## 2016-03-28 ENCOUNTER — Ambulatory Visit (HOSPITAL_BASED_OUTPATIENT_CLINIC_OR_DEPARTMENT_OTHER): Payer: Self-pay | Admitting: Adult Health

## 2016-03-28 ENCOUNTER — Ambulatory Visit
Admission: RE | Admit: 2016-03-28 | Discharge: 2016-03-28 | Disposition: A | Discharge status: Home / Self Care | Payer: Medicare Other | Source: Ambulatory Visit | Attending: Radiation Oncology | Admitting: Radiation Oncology

## 2016-03-28 VITALS — BP 124/79 | HR 72 | Temp 98.4°F | Resp 18 | Ht 70.0 in | Wt 221.6 lb

## 2016-03-28 DIAGNOSIS — C320 Malignant neoplasm of glottis: Secondary | ICD-10-CM

## 2016-03-28 DIAGNOSIS — R634 Abnormal weight loss: Secondary | ICD-10-CM

## 2016-03-28 MED ORDER — LARYNGOSCOPY SOLUTION RAD-ONC
15.0000 mL | Freq: Once | TOPICAL | Status: AC
  Administered 2016-03-28: 15 mL via TOPICAL
  Filled 2016-03-28: qty 15

## 2016-03-28 NOTE — Progress Notes (Signed)
Please see Mignon [place] note from followup.  Today I performed Mr. Derrick Ayala  laryngoscope.  PROCEDURE NOTE: After the patient stated consent, he underwent anesthesia of the nasal cavity with topical lidocaine and phenylephrine; the flexible endoscope was then introduced and passed through the nasal cavity. No sign of tumor in the pharynx or larynx. True vocal cords are symmetrically mobile. -----------------------------------  Eppie Gibson, MD

## 2016-03-29 ENCOUNTER — Encounter: Payer: Self-pay | Admitting: Adult Health

## 2016-03-29 ENCOUNTER — Telehealth: Payer: Self-pay | Admitting: *Deleted

## 2016-03-29 NOTE — Telephone Encounter (Signed)
CALLED PATIENT TO INFORM OF LAB AND FU WITH [name] ON 07-27-16, LVM FOR A RETURN CALL.

## 2016-03-29 NOTE — Progress Notes (Signed)
CLINIC:  Survivorship  REASON FOR VISIT:  Routine follow-up for history of head & neck cancer.  BRIEF ONCOLOGIC HISTORY:    Glottis carcinoma ([place])   08/22/2013 Procedure    (L) vocal cord biopsy: Squamous cell carcinoma with focal necrosis       09/02/2013 Imaging    CT neck: Assymetric thickening of (L) vocal cord concerning for tumor; no significant infraglottic extension.  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 Rounded Level 3 LN not pathologically enlarged, but do raise concern for metastatic disease. Multiple metallic BBs through (L) neck.  Pleural calcs c/w prior asbestos exposure; no evidence of asbestosis.       09/04/2013 Initial Diagnosis    Glottis carcinoma ([place])     09/04/2013 Procedure    FNA neck (cytopathology): No malignant cells present; findings c/w cyst.       09/16/2013 - 10/24/2013 Radiation Therapy    Radiation therapy Isidore Moos). Larynx / 65.25 Gy in 29 fractions        INTERVAL HISTORY:  Derrick Ayala presents today for routine follow-up for his history of squamous cell carcinoma of the glottis.  He is now about 2.5 years out from his completion of treatment and reportedly doing very well.  He does report that he has changed his diet since his last visit and "I've cut out a lot of my starches."  This is what he attributes to his 20 lb weight loss since his last visit to the cancer center.  He denies any fever/chills, cough, pain. He denies any dysphagia. His appetite is good. His bowels are moving well.  He has some fatigue and shortness of breath "when I overdo it."  He remains very active working in his yard and around his home.  He endorses recent episode of back pain after he had to crawl under his house to fix his water pipe; the pain has since resolved.  Overall, he reports feeling great.   Pain: None  Nutritional Status:  -Intake/Diet: Regular diet   -Using a feeding tube? No -Weight change: LOSS ~20 lbs since 07/08/15 (has made some diet changes and "I decreased my starches."   Swallowing:  No concerns  Last ENT visit: 07/2015 with Dr. Lucia Gaskins  Summary of last Rad Onc visit:  07/08/15-Dr. Andris Flurry, normal TSH; follow-up with survivorship in 8 months; RTC 4 months after that for repeat TSH and physical exam.   Last TSH value: TSH 2.648 on 07/08/15    ADDITIONAL REVIEW OF SYSTEMS:  Review of Systems  Constitutional: Negative.        Fatigue "only when I overdo it"   HENT:       -Endorses occasional sinus problems -Problem with intermittent mucus build-up in his throat (reported at last visit in 07/2015) has resolved.   Eyes: Negative.   Respiratory: Positive for shortness of breath. Negative for cough.        "I get short of breath only when I overdo it, then I rest and I am okay."   Cardiovascular: Negative.   Gastrointestinal: Negative.  Negative for constipation, diarrhea, nausea and vomiting.  Genitourinary: Negative.  Negative for dysuria and hematuria.  Musculoskeletal: Negative.   Skin:       Reportedly has psoriasis   Neurological: Negative.   Endo/Heme/Allergies: Negative.        No cold intolerance   Psychiatric/Behavioral: Negative.     CURRENT MEDICATIONS:  Current Outpatient Prescriptions on File Prior to Visit  Medication Sig Dispense Refill  . amLODipine (NORVASC) 10 MG tablet Take 10 mg by mouth daily.    . tamsulosin (FLOMAX) 0.4 MG CAPS capsule Take 0.4 mg by mouth daily after breakfast.    . Pseudoephedrine HCl (SINUS & ALLERGY 12 HOUR PO) Take 1 tablet by mouth as needed (allergies).      No current facility-administered medications on file prior to visit.     ALLERGIES:  No Known Allergies   PHYSICAL EXAM:  Vitals:   03/28/16 1231  BP: 124/79  Pulse: 72  Resp: 18  Temp: 98.4 F (36.9 C)    Weight Date  221 lb 9.6 oz (100.5 kg) 03/28/16  241 lb (109.317 kg) 07/08/15  230 lb 8 oz (104.554  kg) 11/05/14  222 lb 12.8 oz (101.061 kg) 05/28/14  205 lb 3.2 oz (93.078 kg) 12/23/13  191 lb 1.6 oz (86.682 kg) 10/30/13  200 lb 9.6 oz (90.992 kg) 10/21/13  225 lb (102.059 kg) 09/16/13  221 lb (100.244 kg) Pre-treatment: 09/04/13   General: Well-nourished, well-appearing male in no acute distress.  Unaccompanied today.  HEENT: Head is atraumatic and normocephalic.  Pupils equal and reactive to light. Conjunctivae clear without exudate.  Sclerae anicteric. Oral mucosa is pink and moist without lesions.  Tongue pink, moist, and midline. Oropharynx is pink and moist, without lesions. Lymph: No cervical, supraclavicular, or infraclavicular lymphadenopathy noted on palpation.   There is oblong swelling behind (L) ear (about 4 x 2 cm) that is soft to palpation; pt states he hit his head "about a year ago" and this swelling was noted since that time; area is mobile.  Does not feel like lymphadenopathy or solid mass, but rather soft tissue edema/possible fluid collection.  Skin non-red, non-tender, and intact.  Neck: No palpable masses. Skin on neck is intact.    Cardiovascular: Normal rate and rhythm. Respiratory: Clear to auscultation bilaterally. Chest expansion symmetric without accessory muscle use; breathing non-labored.  GI: Abdomen soft and round. Non-tender, non-distended. Bowel sounds normoactive.  GU: Deferred.   Neuro: No focal deficits. Steady gait.   Psych: Normal mood and affect for situation. Extremities: No edema.  Skin: Warm and dry.   *Flexible laryngoscopy performed today by [name]. Please see her note for additional documentation.     LABORATORY DATA:  None at this visit.  DIAGNOSTIC IMAGING:  None at this visit.    ASSESSMENT & PLAN:  Derrick Ayala is a pleasant 81 y.o. male with history of Stage II (T2N0M0) squamous cell carcinoma of the glotts (left vocal cord), treated with radiation therapy; completed treatment on 10/24/13.  Patient presents to survivorship clinic today  for routine follow-up and continued surveillance.   1. Cancer of the glottis:  Derrick Ayala is without evidence of recurrence on physical exam, including flexible laryngoscopy.  He will return to the cancer center in 07/2016 to see [name] with TSH lab before that visit.  I encouraged Derrick Ayala to call me with any questions or concerns before his next appointment at the cancer center.   2. Weight loss: Derrick Ayala has lost about 20 lbs in the past 8 months.  He has altered his diet and decreased sugars/carbs.  He stated that he was not necessarily trying to lose weight, but attributes his weight loss to his diet change.  He also remains very active.  He is back to his "pre-treatment" weight.  We will continue to monitor.  3. Soft tissue swelling in post-auricular area:  Derrick Ayala has area of oblong swelling (greater in length than width" behind his left ear.  He endorses a history of trauma to this area in the past "and the swelling never went away."  Given that he had relatively early stage disease and completed curative treatment over 2.5 years ago, I am less suspicious of a cancer recurrence.  I will share my note today with [name] and if she feels it warrants additional work-up, I will defer to her judgment based on the patient's history.      Dispo:  -Return to cancer center to see [name] in 07/2016 with lab visit to recheck TSH preceding that visit; orders placed today.    A total of 30 minutes was spent in the face-to-face care of this patient, with greater than 50% of that time spent in counseling and care-coordination.   Mike Craze, NP Survivorship Program [place] [phone]   (Coding/Billing notation: This patient is a no charge; patient seen in conjunction with Rad Onc for laryngoscopy; Rad Onc to bill for visit in lieu of this Med Onc encounter)

## 2016-07-22 NOTE — Progress Notes (Signed)
  Derrick Ayala presents for follow up of radiation completed 10/24/2013 to his Larynx.  Pain issues, if any: He denies pain.  Using a feeding tube?: No Weight changes, if any: He has lost 9 lbs since his last visit. He reports to me that he changed his diet after his recent psoroisis outbreak, and has lost weight due to this.  Wt Readings from Last 3 Encounters:  07/27/16 212 lb 6.4 oz (96.3 kg)  03/28/16 221 lb 9.6 oz (100.5 kg)  07/08/15 241 lb (109.3 kg)   Swallowing issues, if any: He denies any difficulty swallowing. Smoking or chewing tobacco? No Using fluoride trays daily? N/A Last ENT visit was on: Dr. Lucia Gaskins April 2016.  Other notable issues, if any:  He tells me when he eats at times his stomach "gets sore".  BP 130/72   Pulse 87   Temp 97.7 F (36.5 C)   Ht 5\' 10"  (1.778 m)   Wt 212 lb 6.4 oz (96.3 kg)   SpO2 100% Comment: room air  BMI 30.48 kg/m

## 2016-07-27 ENCOUNTER — Ambulatory Visit
Admission: RE | Admit: 2016-07-27 | Discharge: 2016-07-27 | Disposition: A | Discharge status: Home / Self Care | Payer: Medicare Other | Source: Ambulatory Visit | Attending: Radiation Oncology | Admitting: Radiation Oncology

## 2016-07-27 ENCOUNTER — Ambulatory Visit (HOSPITAL_BASED_OUTPATIENT_CLINIC_OR_DEPARTMENT_OTHER)
Admission: RE | Admit: 2016-07-27 | Discharge: 2016-07-27 | Disposition: A | Discharge status: Home / Self Care | Payer: Medicare Other | Source: Ambulatory Visit | Attending: Radiation Oncology | Admitting: Radiation Oncology

## 2016-07-27 ENCOUNTER — Encounter: Payer: Self-pay | Admitting: Radiation Oncology

## 2016-07-27 VITALS — BP 130/72 | HR 87 | Temp 97.7°F | Ht 70.0 in | Wt 212.4 lb

## 2016-07-27 DIAGNOSIS — L409 Psoriasis, unspecified: Secondary | ICD-10-CM | POA: Insufficient documentation

## 2016-07-27 DIAGNOSIS — Z79899 Other long term (current) drug therapy: Secondary | ICD-10-CM | POA: Insufficient documentation

## 2016-07-27 DIAGNOSIS — R634 Abnormal weight loss: Secondary | ICD-10-CM

## 2016-07-27 DIAGNOSIS — Z08 Encounter for follow-up examination after completed treatment for malignant neoplasm: Secondary | ICD-10-CM | POA: Diagnosis present

## 2016-07-27 DIAGNOSIS — Z8521 Personal history of malignant neoplasm of larynx: Secondary | ICD-10-CM | POA: Insufficient documentation

## 2016-07-27 DIAGNOSIS — Z923 Personal history of irradiation: Secondary | ICD-10-CM | POA: Insufficient documentation

## 2016-07-27 DIAGNOSIS — C320 Malignant neoplasm of glottis: Secondary | ICD-10-CM

## 2016-07-27 DIAGNOSIS — R635 Abnormal weight gain: Secondary | ICD-10-CM

## 2016-07-27 LAB — TSH: TSH: 2.683 m(IU)/L (ref 0.320–4.118)

## 2016-07-27 MED ORDER — LARYNGOSCOPY SOLUTION RAD-ONC
15.0000 mL | Freq: Once | TOPICAL | Status: AC
  Administered 2016-07-27: 15 mL via TOPICAL
  Filled 2016-07-27: qty 15

## 2016-07-27 NOTE — Progress Notes (Signed)
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   Chief Complaint: here for surveillance of laryngeal cancer  Narrative:  The patient returns today for routine follow-up of radiation completed to his larynx 10/24/13.      On review of systems, the patient denies pain. He is not using a feeding tube. The patient has lost 9 lbs since his last visit; he reports that he changed his diet after a recent psoriasis outbreak, and attributes the weight loss to this. He reports that occasionally when he eats his stomach "gets sore." The patient denies difficulty swallowing. He denies smoking or chewing tobacco. His last ENT visit was with Dr. Lucia Gaskins in April 2016. Voice is not hoarse. Doing well.    BP 130/72   Pulse 87   Temp 97.7 F (36.5 C)   Ht 5\' 10"  (1.778 m)   Wt 212 lb 6.4 oz (96.3 kg)   SpO2 100% Comment: room air  BMI 30.48 kg/m                    ALLERGIES:  has No Known Allergies.  Meds: Current Outpatient Prescriptions  Medication Sig Dispense Refill  . amLODipine (NORVASC) 10 MG tablet Take 10 mg by mouth daily.    . Pseudoephedrine HCl (SINUS & ALLERGY 12 HOUR PO) Take 1 tablet by mouth as needed (allergies).     . tamsulosin (FLOMAX) 0.4 MG CAPS capsule Take 0.4 mg by mouth daily after breakfast.    . UNABLE TO FIND 1 tablet 2 (two) times daily. Med Name: red/ clover combination    . valsartan (DIOVAN) 320 MG tablet Take 320 mg by mouth daily.     No current facility-administered medications for this encounter.     Physical Findings: The patient is in no acute distress. Patient is alert and oriented.  height is 5\' 10"  (1.778 m)  and weight is 212 lb 6.4 oz (96.3 kg). His temperature is 97.7 F (36.5 C). His blood pressure is 130/72 and his pulse is 87. His oxygen saturation is 100%. .  General: Alert and oriented, in no acute distress HEENT: The patient has upper dentures which were removed for the exam. Oropharynx and oral cavity are clear. Soft mass in the L posterior auricular region which is benign feeling on palpation and stable to previous exam.  Neck: No palpable cervical or supraclavicular lymphadenopathy. Heart: Regular in rate and rhythm with no murmurs. Chest: Clear to auscultation bilaterally. Abdomen: Soft, nontender, nondistended, with no rigidity or guarding.  Lymphatics: see Neck Exam Skin: No concerning lesions. Skin is intact over the neck. Neurologic: No obvious focalities. Speech is fluent. Ambulatory and stable on feet Psychiatric: Judgment and insight are intact. Affect is appropriate.  PROCEDURE NOTE: After obtaining verbal consent and anesthetizing the nasal cavity with topical lidocaine and phenylephrine, the flexible endoscope was introduced and passed through the nasal cavity. No lesions in the pharynx or the larynx. The cords are symmetrically mobile without lesions.   Lab Findings: Lab Results  Component Value Date   WBC 3.3 (L) 11/12/2013   HGB 8.7 (L) 11/12/2013   HCT  26.6 (L) 11/12/2013   MCV 96.4 11/12/2013   PLT 164 11/12/2013    Lab Results  Component Value Date   TSH 2.648 07/08/2015    Radiographic Findings: No results found.  Impression/Plan:    1) Head and Neck Cancer Status: NED  2) Nutritional Status: no issues   3) Risk Factors: The patient has been educated about risk factors including alcohol and tobacco abuse; they understand that avoidance of alcohol and tobacco is important to prevent recurrences as well as other cancers. He is not using tobacco.  4) Swallowing: good   5) Thyroid function: good - normal TSH  Lab Results  Component Value Date   TSH  2.683 07/27/2016    6) Social: No active social issues to address at this time   The patient will follow up with Dr. Lucia Gaskins in 6 months, and follow up with me in a year. He patient was encouraged to call with any issues or questions before then.    _____________________________________   Eppie Gibson, MD  This document serves as a record of services personally performed by Eppie Gibson, MD. It was created on her behalf by Maryla Morrow, a trained medical scribe. The creation of this record is based on the scribe's personal observations and the provider's statements to them. This document has been checked and approved by the attending provider.

## 2016-07-28 ENCOUNTER — Telehealth: Payer: Self-pay | Admitting: *Deleted

## 2016-07-28 ENCOUNTER — Other Ambulatory Visit: Payer: Self-pay | Admitting: Radiation Oncology

## 2016-07-28 NOTE — Telephone Encounter (Signed)
CALLED PATIENT TO INFORM OF 1 YR. FU ON 07/28/17 @ 2 PM, SPOKE WITH PATIENT AND HE IS AWARE OF THIS APPT.

## 2016-07-29 ENCOUNTER — Telehealth: Payer: Self-pay | Admitting: *Deleted

## 2016-07-29 NOTE — Telephone Encounter (Signed)
Oncology Nurse Navigator Documentation  Per Dr. Pearlie Oyster guidance, called ENT Dr. Pollie Friar office to arrange appt.   Spoke with Susie, requested patient be contacted and appt arranged for routine follow-up with Dr. Lucia Gaskins in 6 months.  She verbalized understanding.  Gayleen Orem, RN, BSN, [facility] [phone]

## 2017-07-20 NOTE — Progress Notes (Signed)
Derrick Ayala presents for follow up of radiation completed 10/24/13 to his Larynx.   Pain issues, if any: No Using a feeding tube?: No Weight changes, if any: Weight down 17.8 lbs. Since last visit states he is eating less,has an appetite. Wt Readings from Last 3 Encounters:  07/28/17 194 lb 9.6 oz (88.3 kg)  07/27/16 212 lb 6.4 oz (96.3 kg)  03/28/16 221 lb 9.6 oz (100.5 kg)   Swallowing issues, if any: yes has some problem eating foods like cornbread makes him choke, no problems with likes. Smoking or chewing tobacco? No Using fluoride trays daily? Using regular tooth paste. Last ENT visit was on: Dr. Lucia Gaskins Other notable issues, if any:  BP 133/66 (BP Location: Right Arm, Patient Position: Sitting, Cuff Size: Normal)   Pulse 84   Temp 98.3 F (36.8 C) (Oral)   Resp 18   Ht 5\' 10"  (1.778 m)   Wt 194 lb 9.6 oz (88.3 kg)   SpO2 100%   BMI 27.92 kg/m   BP 112/72 (BP Location: Right Arm, Patient Position: Standing)   Pulse 97   Temp 98.3 F (36.8 C) (Oral)   Resp 18   Ht 5\' 10"  (1.778 m)   Wt 194 lb 9.6 oz (88.3 kg)   SpO2 100%   BMI 27.92 kg/m

## 2017-07-28 ENCOUNTER — Ambulatory Visit
Admission: RE | Admit: 2017-07-28 | Discharge: 2017-07-28 | Disposition: A | Discharge status: Home / Self Care | Payer: Medicare Other | Source: Ambulatory Visit | Attending: Radiation Oncology | Admitting: Radiation Oncology

## 2017-07-28 ENCOUNTER — Encounter: Payer: Self-pay | Admitting: Radiation Oncology

## 2017-07-28 ENCOUNTER — Other Ambulatory Visit: Payer: Self-pay

## 2017-07-28 VITALS — BP 112/72 | HR 97 | Temp 98.3°F | Resp 18 | Ht 70.0 in | Wt 194.6 lb

## 2017-07-28 DIAGNOSIS — Z1329 Encounter for screening for other suspected endocrine disorder: Secondary | ICD-10-CM | POA: Diagnosis not present

## 2017-07-28 DIAGNOSIS — Z08 Encounter for follow-up examination after completed treatment for malignant neoplasm: Secondary | ICD-10-CM | POA: Diagnosis not present

## 2017-07-28 DIAGNOSIS — Z923 Personal history of irradiation: Secondary | ICD-10-CM | POA: Insufficient documentation

## 2017-07-28 DIAGNOSIS — Z8521 Personal history of malignant neoplasm of larynx: Secondary | ICD-10-CM | POA: Insufficient documentation

## 2017-07-28 DIAGNOSIS — C320 Malignant neoplasm of glottis: Secondary | ICD-10-CM

## 2017-07-28 DIAGNOSIS — Z79899 Other long term (current) drug therapy: Secondary | ICD-10-CM | POA: Diagnosis not present

## 2017-07-28 LAB — TSH: TSH: 1.837 m(IU)/L (ref 0.320–4.118)

## 2017-07-28 MED ORDER — LARYNGOSCOPY SOLUTION RAD-ONC
15.0000 mL | Freq: Once | TOPICAL | Status: DC
  Filled 2017-07-28: qty 15

## 2017-07-28 NOTE — Progress Notes (Signed)
Radiation Oncology         [phone] ________________________________  Name: Derrick Ayala MRN: [id number]  Date: 07/28/2017  DOB: 1934-08-22  Follow-Up Visit Note  CC: Koirala, Dibas, MD  Rozetta Nunnery, *  Diagnosis and Prior Radiotherapy:       ICD-10-CM   1. Screening for hypothyroidism Z13.29 TSH    TSH    Fiberoptic laryngoscopy  2. 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                 Pain issues, if any: He denies.  Using a feeding tube?: No  Weight changes, if any: His weight has decreased 17.8 lbs. He reports unchanged appetite but is eating less. Wt Readings from Last 3 Encounters:  07/28/17 194 lb 9.6 oz (88.3 kg)  07/27/16 212 lb 6.4 oz (96.3 kg)  03/28/16 221 lb 9.6 oz (100.5 kg)   Swallowing issues, if any: He reports some problems eating foods like cornbread. He states that it makes him choke.  Smoking or chewing tobacco? No  Using fluoride trays daily? Using regular tooth paste.  Last ENT visit was on: Dr. Lucia Gaskins  Other notable issues, if any: He reports a "mucus" that moves upward from his stomach into his throat. No abd pain  ALLERGIES:  has No Known Allergies.  Meds: Current Outpatient Medications  Medication Sig Dispense Refill  . amLODipine (NORVASC) 10 MG tablet Take 10 mg by mouth daily.    . hydrochlorothiazide (HYDRODIURIL) 25 MG tablet Take 25 mg by mouth daily.    . Pseudoephedrine HCl (SINUS & ALLERGY 12 HOUR PO) Take 1 tablet by mouth as needed (allergies).     . sildenafil (VIAGRA) 100 MG tablet Take 100 mg by  mouth as needed for erectile dysfunction.    . tamsulosin (FLOMAX) 0.4 MG CAPS capsule Take 0.4 mg by mouth daily after breakfast.    . triamcinolone ointment (KENALOG) 0.1 % Apply 1 application topically 2 (two) times daily. Taking 1-2 weeks twice a day    . valsartan (DIOVAN) 320 MG tablet Take 320 mg by mouth daily.     Current Facility-Administered Medications  Medication Dose Route Frequency Provider Last Rate Last Dose  . laryngocopy solution for Rad-Onc  15 mL Topical Once Eppie Gibson, MD        Physical Findings: Wt Readings from Last 3 Encounters:  07/28/17 194 lb 9.6 oz (88.3 kg)  07/27/16 212 lb 6.4 oz (96.3 kg)  03/28/16 221 lb 9.6 oz (100.5 kg)    height is 5\' 10"  (1.778 m) and weight is 194 lb 9.6 oz (88.3 kg). His oral temperature is 98.3 F (36.8 C). His blood pressure is 112/72 and his pulse is 97. His respiration is 18 and oxygen saturation is 100%.  General: Alert and oriented, in no acute distress. HEENT: Head is normocephalic. Oropharynx and oral cavity are clear.  Neck: No palpable cervical or supraclavicular lymphadenopathy. Skin: Skin in treatment fields shows satisfactory healing with some pigmentation changes in the radiation field anteriorly. Heart: Regular  in rate and rhythm with no murmurs, rubs, or gallops. Chest: Clear to auscultation bilaterally, with no rhonchi, wheezes, or rales. Lymphatics: see Neck Exam Psychiatric: Judgment and insight are intact. Affect is appropriate.  PROCEDURE NOTE: After obtaining consent and anesthetizing the nasal cavity with topical lidocaine and phenylephrine, the flexible endoscope was introduced and passed through the nasal cavity.  No lesions in the larynx or pharynx. The true cords are symmetrically mobile.   Lab Findings: Lab Results  Component Value Date   WBC 3.3 (L) 11/12/2013   HGB 8.7 (L) 11/12/2013   HCT 26.6 (L) 11/12/2013   MCV 96.4 11/12/2013   PLT 164 11/12/2013    Lab Results  Component Value Date    TSH 1.837 07/28/2017    Radiographic Findings: No results found.  Impression/Plan:    1) Head and Neck Cancer Status: NED  2) Nutritional Status: Weight has decreased 18 lbs in the past year. Patient has good appetite but eats less.  PEG tube: N/A  3) Risk Factors: The patient has been educated about risk factors including alcohol and tobacco abuse; they understand that avoidance of alcohol and tobacco is important to prevent recurrences as well as other cancers  4) Swallowing: He has some issues swallowing dry foods.   5) Dental: Encouraged to continue regular followup with dentistry. The radiation did not affect his mouth. 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 He does not currently take a thyroid supplement, but I will order one if necessary. --> WNL Lab Results  Component Value Date   TSH 1.837 07/28/2017   7) Follow-up in 12 months. The patient was encouraged to call with any issues or questions before then.      Eppie Gibson, MD  This document serves as a record of services personally performed by Eppie Gibson, MD. It was created on her behalf by Rae Lips, a trained medical scribe. The creation of this record is based on the scribe's personal observations and the provider's statements to them. This document has been checked and approved by the attending provider.

## 2017-07-28 NOTE — Addendum Note (Signed)
Encounter addended by: Eppie Gibson, MD on: 07/28/2017 5:49 PM  Actions taken: LOS modified, Follow-up modified

## 2017-08-14 ENCOUNTER — Ambulatory Visit
Admission: RE | Admit: 2017-08-14 | Discharge: 2017-08-14 | Disposition: A | Discharge status: Home / Self Care | Payer: Medicare Other | Source: Ambulatory Visit | Attending: Gastroenterology | Admitting: Gastroenterology

## 2017-08-14 ENCOUNTER — Other Ambulatory Visit: Payer: Self-pay | Admitting: Gastroenterology

## 2017-08-14 DIAGNOSIS — R05 Cough: Secondary | ICD-10-CM

## 2017-08-14 DIAGNOSIS — R059 Cough, unspecified: Secondary | ICD-10-CM

## 2017-10-12 ENCOUNTER — Other Ambulatory Visit: Payer: Self-pay | Admitting: Gastroenterology

## 2017-10-16 ENCOUNTER — Other Ambulatory Visit: Payer: Self-pay

## 2017-10-16 ENCOUNTER — Encounter (HOSPITAL_COMMUNITY): Payer: Self-pay | Admitting: Emergency Medicine

## 2017-10-19 ENCOUNTER — Other Ambulatory Visit: Payer: Self-pay

## 2017-10-19 ENCOUNTER — Ambulatory Visit (HOSPITAL_COMMUNITY): Payer: Medicare Other | Admitting: Registered Nurse

## 2017-10-19 ENCOUNTER — Encounter (HOSPITAL_COMMUNITY)
Admission: RE | Disposition: A | Discharge status: Home / Self Care | Payer: Self-pay | Source: Ambulatory Visit | Attending: Gastroenterology

## 2017-10-19 ENCOUNTER — Ambulatory Visit (HOSPITAL_COMMUNITY)
Admission: RE | Admit: 2017-10-19 | Discharge: 2017-10-19 | Disposition: A | Discharge status: Home / Self Care | Payer: Medicare Other | Source: Ambulatory Visit | Attending: Gastroenterology | Admitting: Gastroenterology

## 2017-10-19 ENCOUNTER — Encounter (HOSPITAL_COMMUNITY): Payer: Self-pay

## 2017-10-19 DIAGNOSIS — D509 Iron deficiency anemia, unspecified: Secondary | ICD-10-CM | POA: Diagnosis not present

## 2017-10-19 DIAGNOSIS — R195 Other fecal abnormalities: Secondary | ICD-10-CM | POA: Insufficient documentation

## 2017-10-19 DIAGNOSIS — Z86010 Personal history of colonic polyps: Secondary | ICD-10-CM | POA: Diagnosis not present

## 2017-10-19 DIAGNOSIS — Z79899 Other long term (current) drug therapy: Secondary | ICD-10-CM | POA: Diagnosis not present

## 2017-10-19 DIAGNOSIS — Z8521 Personal history of malignant neoplasm of larynx: Secondary | ICD-10-CM | POA: Insufficient documentation

## 2017-10-19 DIAGNOSIS — I10 Essential (primary) hypertension: Secondary | ICD-10-CM | POA: Insufficient documentation

## 2017-10-19 DIAGNOSIS — Z87891 Personal history of nicotine dependence: Secondary | ICD-10-CM | POA: Insufficient documentation

## 2017-10-19 DIAGNOSIS — Z923 Personal history of irradiation: Secondary | ICD-10-CM | POA: Insufficient documentation

## 2017-10-19 HISTORY — PX: ESOPHAGOGASTRODUODENOSCOPY (EGD) WITH PROPOFOL: SHX5813

## 2017-10-19 HISTORY — PX: COLONOSCOPY WITH PROPOFOL: SHX5780

## 2017-10-19 SURGERY — COLONOSCOPY WITH PROPOFOL
Anesthesia: Monitor Anesthesia Care

## 2017-10-19 MED ORDER — LACTATED RINGERS IV SOLN
INTRAVENOUS | Status: DC
Start: 2017-10-19 — End: 2017-10-19
  Administered 2017-10-19: 10:00:00 via INTRAVENOUS

## 2017-10-19 MED ORDER — PROPOFOL 10 MG/ML IV BOLUS
INTRAVENOUS | Status: DC | PRN
  Administered 2017-10-19: 20 mg via INTRAVENOUS

## 2017-10-19 MED ORDER — LIDOCAINE 2% (20 MG/ML) 5 ML SYRINGE
INTRAMUSCULAR | Status: DC | PRN
  Administered 2017-10-19: 60 mg via INTRAVENOUS

## 2017-10-19 MED ORDER — PROPOFOL 10 MG/ML IV BOLUS
INTRAVENOUS | Status: AC
  Filled 2017-10-19: qty 40

## 2017-10-19 MED ORDER — EPHEDRINE SULFATE-NACL 50-0.9 MG/10ML-% IV SOSY
PREFILLED_SYRINGE | INTRAVENOUS | Status: DC | PRN
  Administered 2017-10-19 (×2): 15 mg via INTRAVENOUS

## 2017-10-19 MED ORDER — PHENYLEPHRINE 40 MCG/ML (10ML) SYRINGE FOR IV PUSH (FOR BLOOD PRESSURE SUPPORT)
PREFILLED_SYRINGE | INTRAVENOUS | Status: DC | PRN
  Administered 2017-10-19 (×3): 80 ug via INTRAVENOUS

## 2017-10-19 MED ORDER — ONDANSETRON HCL 4 MG/2ML IJ SOLN
INTRAMUSCULAR | Status: DC | PRN
  Administered 2017-10-19: 4 mg via INTRAVENOUS

## 2017-10-19 MED ORDER — PROPOFOL 500 MG/50ML IV EMUL
INTRAVENOUS | Status: DC | PRN
  Administered 2017-10-19: 100 ug/kg/min via INTRAVENOUS

## 2017-10-19 SURGICAL SUPPLY — 24 items

## 2017-10-19 NOTE — Anesthesia Postprocedure Evaluation (Signed)
Anesthesia Post Note  Patient: Derrick Ayala  Procedure(s) Performed: COLONOSCOPY WITH PROPOFOL (N/A ) ESOPHAGOGASTRODUODENOSCOPY (EGD) WITH PROPOFOL (N/A )     Patient location during evaluation: Endoscopy Anesthesia Type: MAC Level of consciousness: awake Pain management: pain level controlled Vital Signs Assessment: post-procedure vital signs reviewed and stable Respiratory status: spontaneous breathing Cardiovascular status: stable Anesthetic complications: no    Last Vitals:  Vitals Value Taken Time  BP    Temp    Pulse    Resp    SpO2      Last Pain:  Vitals:   10/19/17 1200  TempSrc:   PainSc: 0-No pain                 [name]

## 2017-10-19 NOTE — Anesthesia Preprocedure Evaluation (Addendum)
Anesthesia Evaluation  Patient identified by MRN, date of birth, ID band Patient awake    Reviewed: Allergy & Precautions, NPO status , Patient's Chart, lab work & pertinent test results  Airway Mallampati: II       Dental   Pulmonary former smoker,    breath sounds clear to auscultation       Cardiovascular hypertension,  Rhythm:Regular Rate:Normal     Neuro/Psych    GI/Hepatic Neg liver ROS, History noted. CG   Endo/Other  negative endocrine ROS  Renal/GU negative Renal ROS     Musculoskeletal   Abdominal   Peds  Hematology   Anesthesia Other Findings   Reproductive/Obstetrics                             Anesthesia Physical Anesthesia Plan  ASA: III  Anesthesia Plan: MAC   Post-op Pain Management:    Induction: Intravenous  PONV Risk Score and Plan: 1 and Treatment may vary due to age or medical condition  Airway Management Planned: Simple Face Mask and Nasal Cannula  Additional Equipment:   Intra-op Plan:   Post-operative Plan:   Informed Consent: I have reviewed the patients History and Physical, chart, labs and discussed the procedure including the risks, benefits and alternatives for the proposed anesthesia with the patient or authorized representative who has indicated his/her understanding and acceptance.   Dental advisory given  Plan Discussed with: Anesthesiologist and CRNA  Anesthesia Plan Comments:         Anesthesia Quick Evaluation

## 2017-10-19 NOTE — Discharge Instructions (Signed)
Colonoscopy, Adult, Care After °This sheet gives you information about how to care for yourself after your procedure. Your doctor may also give you more specific instructions. If you have problems or questions, call your doctor. °Follow these instructions at home: °General instructions ° °· For the first 24 hours after the procedure: °? Do not drive or use machinery. °? Do not sign important documents. °? Do not drink alcohol. °? Do your daily activities more slowly than normal. °? Eat foods that are soft and easy to digest. °? Rest often. °· Take over-the-counter or prescription medicines only as told by your doctor. °· It is up to you to get the results of your procedure. Ask your doctor, or the department performing the procedure, when your results will be ready. °To help cramping and bloating: °· Try walking around. °· Put heat on your belly (abdomen) as told by your doctor. Use a heat source that your doctor recommends, such as a moist heat pack or a heating pad. °? Put a towel between your skin and the heat source. °? Leave the heat on for 20-30 minutes. °? Remove the heat if your skin turns bright red. This is especially important if you cannot feel pain, heat, or cold. You can get burned. °Eating and drinking °· Drink enough fluid to keep your pee (urine) clear or pale yellow. °· Return to your normal diet as told by your doctor. Avoid heavy or fried foods that are hard to digest. °· Avoid drinking alcohol for as long as told by your doctor. °Contact a doctor if: °· You have blood in your poop (stool) 2-3 days after the procedure. °Get help right away if: °· You have more than a small amount of blood in your poop. °· You see large clumps of tissue (blood clots) in your poop. °· Your belly is swollen. °· You feel sick to your stomach (nauseous). °· You throw up (vomit). °· You have a fever. °· You have belly pain that gets worse, and medicine does not help your pain. °This information is not intended to  replace advice given to you by your health care provider. Make sure you discuss any questions you have with your health care provider. °Document Released: 08/20/2010 Document Revised: 04/11/2016 Document Reviewed: 04/11/2016 °Elsevier Interactive Patient Education © 2017 [employer] °Esophagogastroduodenoscopy, Care After °Refer to this sheet in the next few weeks. These instructions provide you with information about caring for yourself after your procedure. Your health care provider may also give you more specific instructions. Your treatment has been planned according to current medical practices, but problems sometimes occur. Call your health care provider if you have any problems or questions after your procedure. °What can I expect after the procedure? °After the procedure, it is common to have: °· A sore throat. °· Nausea. °· Bloating. °· Dizziness. °· Fatigue. ° °Follow these instructions at home: °· Do not eat or drink anything until the numbing medicine (local anesthetic) has worn off and your gag reflex has returned. You will know that the local anesthetic has worn off when you can swallow comfortably. °· Do not drive for 24 hours if you received a medicine to help you relax (sedative). °· If your health care provider took a tissue sample for testing during the procedure, make sure to get your test results. This is your responsibility. Ask your health care provider or the department performing the test when your results will be ready. °· Keep all follow-up visits as told   by your health care provider. This is important. °Contact a health care provider if: °· You cannot stop coughing. °· You are not urinating. °· You are urinating less than usual. °Get help right away if: °· You have trouble swallowing. °· You cannot eat or drink. °· You have throat or chest pain that gets worse. °· You are dizzy or light-headed. °· You faint. °· You have nausea or vomiting. °· You have chills. °· You have a fever. °· You  have severe abdominal pain. °· You have black, tarry, or bloody stools. °This information is not intended to replace advice given to you by your health care provider. Make sure you discuss any questions you have with your health care provider. °Document Released: 07/04/2012 Document Revised: 12/24/2015 Document Reviewed: 06/11/2015 °Elsevier Interactive Patient Education © 2018 [employer] Inc. ° °

## 2017-10-19 NOTE — Op Note (Signed)
[facility] Patient Name: Derrick Ayala Procedure Date: 10/19/2017 MRN: [id number] Attending MD: Nancy Fetter Dr., MD Date of Birth: August 22, 1934 CSN: [id number] Age: 83 Admit Type: Outpatient Procedure:                Upper GI endoscopy Indications:              Unexplained iron deficiency anemia, Occult blood in                            stool Providers:                Jeneen Rinks L. Oletta Lamas Dr., MD, Elmer Ramp. Hinson, RN,                            [employer], Technician, Marla Roe, CRNA Referring MD:              Medicines:                Monitored Anesthesia Care Complications:            No immediate complications. Estimated Blood Loss:     Estimated blood loss: none. Procedure:                Pre-Anesthesia Assessment:                           - Prior to the procedure, a History and Physical                            was performed, and patient medications and                            allergies were reviewed. The patient's tolerance of                            previous anesthesia was also reviewed. The risks                            and benefits of the procedure and the sedation                            options and risks were discussed with the patient.                            All questions were answered, and informed consent                            was obtained. Prior Anticoagulants: The patient has                            taken no previous anticoagulant or antiplatelet                            agents. ASA Grade Assessment: III - A patient with  severe systemic disease. After reviewing the risks                            and benefits, the patient was deemed in                            satisfactory condition to undergo the procedure.                           After obtaining informed consent, the endoscope was                            passed under direct vision. Throughout the   procedure, the patient's blood pressure, pulse, and                            oxygen saturations were monitored continuously. The                            [id number]) scope was introduced through the                            mouth, and advanced to the third part of duodenum.                            The upper GI endoscopy was accomplished without                            difficulty. The patient tolerated the procedure                            well. Due to his past history of laryngeal                            carcinoma and previous radiation therapy the                            pediatric upper endoscope was utilized. Scope In: Scope Out: Findings:      The examined esophagus was normal. There was minimal pressure passing       the scope into the esophagus.      The stomach was normal.      The examined duodenum was normal. Impression:               - Normal esophagus.                           - Normal stomach.                           - Normal examined duodenum.                           - No specimens collected.                           -  Blood in stool without cause found on endoscopic                            exam Moderate Sedation:      See anesthesia note, no moderate sedation. Recommendation:           - Patient has a contact number available for                            emergencies. The signs and symptoms of potential                            delayed complications were discussed with the                            patient. Return to normal activities tomorrow.                            Written discharge instructions were provided to the                            patient.                           - Resume previous diet.                           - Continue present medications.                           - See the other procedure note for documentation of                            additional recommendations. Procedure Code(s):        ---  Professional ---                           [id number], Esophagogastroduodenoscopy, flexible,                            transoral; diagnostic, including collection of                            specimen(s) by brushing or washing, when performed                            (separate procedure) Diagnosis Code(s):        --- Professional ---                           D50.9, Iron deficiency anemia, unspecified                           R19.5, Other fecal abnormalities CPT copyright 2016 American Medical Association. All rights reserved. The codes documented in this report are preliminary and upon coder review may  be revised to meet current compliance requirements. Nancy Fetter Dr., MD 10/19/2017  11:48:33 AM This report has been signed electronically. Number of Addenda: 0

## 2017-10-19 NOTE — Anesthesia Procedure Notes (Signed)
Date/Time: 10/19/2017 11:06 AM Performed by: Talbot Grumbling, CRNA Oxygen Delivery Method: Nasal cannula

## 2017-10-19 NOTE — H&P (Signed)
Subjective:   Patient is a 83 y.o. male presents with in positive stool history of colon polyps and radiation therapy for laryngeal carcinoma.. Procedure including risks and benefits discussed in office.  Patient Active Problem List   Diagnosis Date Noted  . Unspecified essential hypertension 11/01/2013  . Hypokalemia 10/22/2013  . 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 Cancer ([place]) 08/22/13 bx   left vocal cord,  . Hypertension   . Hypokalemia   . 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 HERNIA REPAIR     12-13 yrs ago    Medications Prior to Admission  Medication Sig Dispense Refill Last Dose  . amLODipine (NORVASC) 10 MG tablet Take 10 mg by mouth daily.   10/19/2017 at 0500  . betamethasone dipropionate (DIPROLENE) 0.05 % ointment Apply 1 application topically 2 (two) times daily.   Past Week at Unknown time  . Diphenhydramine-PE-APAP (NIGHTTIME COLD & FLU MAX STR PO) Take 1 tablet by mouth at bedtime as needed (for congestion).   Past Week at Unknown time  . hydrochlorothiazide (HYDRODIURIL) 25 MG tablet Take 25 mg by mouth daily.   Past Week at Unknown time  . irbesartan (AVAPRO) 300 MG tablet Take 300 mg by mouth daily.   Past Week at Unknown time  . methotrexate 2.5 MG tablet Take 15 mg by mouth every Saturday.   Past Week at Unknown time  . pseudoephedrine (SUDAFED) 120 MG 12 hr tablet Take 120 mg by mouth daily as needed for congestion.   Past Week at Unknown time  . sodium chloride (OCEAN) 0.65 % SOLN nasal spray Place 2 sprays into both nostrils daily.   Past Week at Unknown time  . tamsulosin (FLOMAX) 0.4 MG CAPS capsule Take 0.4 mg by mouth daily after breakfast.   Past Week at Unknown time  . triamcinolone cream (KENALOG) 0.1 % Apply 1 application  topically 2 (two) times daily.    Past Week at Unknown time  . sildenafil (VIAGRA) 100 MG tablet Take 100 mg by mouth as needed for erectile dysfunction.   Unknown at Unknown time   No Known Allergies  Social History   Tobacco Use  . Smoking status: Former Smoker    Packs/day: 0.25    Years: 12.00    Pack years: 3.00    Last attempt to quit: 07/02/1999    Years since quitting: 18.3  . Smokeless tobacco: Former Systems developer    Types: Chew    Quit date: 09/04/2013  Substance Use Topics  . Alcohol use: Yes    Comment: 1 beer or shot vodka or gin occasionally    Family History  Problem Relation Age of Onset  . Cancer Mother        unknown  . Cancer Brother        lung ca, smoker     Objective:   Patient Vitals for the past 8 hrs:  Temp Temp src Pulse Resp Height Weight  10/19/17 0920 98.1 F (36.7 C) Oral 76 17 5\' 10"  (1.778 m) 83.9 kg (185 lb)   No intake/output data recorded. No intake/output data recorded.   See MD Preop evaluation      Assessment:   1. Heme positive stool 2. History of colon polyps 3. History laryngeal carcinoma  Plan:   Will proceed with EGD and colonoscopy. We use the pediatric endoscope is necessary. We have discussed this in detail with the patient in the office. Marland Kitchen

## 2017-10-19 NOTE — Transfer of Care (Signed)
Immediate Anesthesia Transfer of Care Note  Patient: Derrick Ayala  Procedure(s) Performed: COLONOSCOPY WITH PROPOFOL (N/A ) ESOPHAGOGASTRODUODENOSCOPY (EGD) WITH PROPOFOL (N/A )  Patient Location: PACU  Anesthesia Type:MAC  Level of Consciousness: awake, alert  and oriented  Airway & Oxygen Therapy: Patient Spontanous Breathing and Patient connected to nasal cannula oxygen  Post-op Assessment: Report given to RN and Post -op Vital signs reviewed and stable  Post vital signs: Reviewed and stable  Last Vitals:  Vitals Value Taken Time  BP    Temp    Pulse    Resp    SpO2      Last Pain:  Vitals:   10/19/17 0920  TempSrc: Oral  PainSc: 0-No pain         Complications: No apparent anesthesia complications

## 2017-10-19 NOTE — Op Note (Signed)
[facility] Patient Name: Derrick Ayala Procedure Date: 10/19/2017 MRN: [id number] Attending MD: Nancy Fetter Dr., MD Date of Birth: [birth year] CSN: [id number] Age: 83 Admit Type: Outpatient Procedure:                Colonoscopy Indications:              Heme positive stool, Iron deficiency anemia Providers:                Jeneen Rinks L. Oletta Lamas Dr., MD, Elmer Ramp. Hinson, RN,                            [employer], Technician, Marla Roe, CRNA Referring MD:              Medicines:                Monitored Anesthesia Care Complications:            No immediate complications. Estimated Blood Loss:     Estimated blood loss: none. Procedure:                Pre-Anesthesia Assessment:                           - Prior to the procedure, a History and Physical                            was performed, and patient medications and                            allergies were reviewed. The patient's tolerance of                            previous anesthesia was also reviewed. The risks                            and benefits of the procedure and the sedation                            options and risks were discussed with the patient.                            All questions were answered, and informed consent                            was obtained. Prior Anticoagulants: The patient has                            taken no previous anticoagulant or antiplatelet                            agents. ASA Grade Assessment: III - A patient with                            severe systemic disease. After reviewing the risks  and benefits, the patient was deemed in                            satisfactory condition to undergo the procedure.                           - Prior to the procedure, a History and Physical                            was performed, and patient medications and                            allergies were reviewed. The patient's tolerance of                     previous anesthesia was also reviewed. The risks                            and benefits of the procedure and the sedation                            options and risks were discussed with the patient.                            All questions were answered, and informed consent                            was obtained. Prior Anticoagulants: The patient has                            taken no previous anticoagulant or antiplatelet                            agents. ASA Grade Assessment: III - A patient with                            severe systemic disease. After reviewing the risks                            and benefits, the patient was deemed in                            satisfactory condition to undergo the procedure.                           After obtaining informed consent, the colonoscope                            was passed under direct vision. Throughout the                            procedure, the patient's blood pressure, pulse, and  oxygen saturations were monitored continuously. The                            EC-[id number] ([id number]) scope was introduced through                            the anus and advanced to the the cecum, identified                            by appendiceal orifice and ileocecal valve. The                            colonoscopy was performed without difficulty. The                            patient tolerated the procedure well. The quality                            of the bowel preparation was good. The ileocecal                            valve, appendiceal orifice, and rectum were                            photographed. Scope In: 11:20:05 AM Scope Out: 11:40:37 AM Scope Withdrawal Time: 0 hours 13 minutes 14 seconds  Total Procedure Duration: 0 hours 20 minutes 32 seconds  Findings:      There is no endoscopic evidence of bleeding, diverticula, inflammation,       mass, polyps or tumor in the entire colon.       The retroflexed view of the distal rectum and anal verge was normal and       showed no anal or rectal abnormalities. Impression:               - The distal rectum and anal verge are normal on                            retroflexion view.                           - No specimens collected.                           - Personal history of colonic polyps.                           - Iron Deficiency Anemia                           - Blood in stool without cause found on endoscopic                            exam Moderate Sedation:      See anesthesia note, no moderate sedation. Recommendation:           - Patient has a contact number available  for                            emergencies. The signs and symptoms of potential                            delayed complications were discussed with the                            patient. Return to normal activities tomorrow.                            Written discharge instructions were provided to the                            patient.                           - Resume previous diet.                           - Continue present medications.                           - Repeat colonoscopy in 5 years for surveillance.                           - Return to endoscopist in 1 month. Procedure Code(s):        --- Professional ---                           [phone], Colonoscopy, flexible; diagnostic, including                            collection of specimen(s) by brushing or washing,                            when performed (separate procedure) Diagnosis Code(s):        --- Professional ---                           R19.5, Other fecal abnormalities                           D50.9, Iron deficiency anemia, unspecified                           Z86.010, Personal history of colonic polyps CPT copyright 2016 American Medical Association. All rights reserved. The codes documented in this report are preliminary and upon coder review may  be revised to meet  current compliance requirements. Nancy Fetter Dr., MD 10/19/2017 11:53:55 AM This report has been signed electronically. Number of Addenda: 0

## 2017-10-20 ENCOUNTER — Encounter (HOSPITAL_COMMUNITY): Payer: Self-pay | Admitting: Gastroenterology

## 2017-12-29 ENCOUNTER — Other Ambulatory Visit: Payer: Self-pay | Admitting: Radiation Oncology

## 2017-12-29 ENCOUNTER — Telehealth: Payer: Self-pay

## 2017-12-29 DIAGNOSIS — D649 Anemia, unspecified: Secondary | ICD-10-CM

## 2017-12-29 NOTE — Telephone Encounter (Signed)
I called and spoke to Derrick Ayala today. I informed him that I had spoken with [name] today about his PCP's recommendations for evaluation of his anemia and possible IV iron. I informed him that [name] does not administer IV iron and her speciality is radiation oncology. [name] has made a referral to a hematologist for Mr. Jou. I told Mr. Groesbeck that he will be receiving a phone call with that appointment as soon as it is scheduled. He voiced his understanding. I also informed him that if he develops symptoms that are severe or life threatening he needs to present to the Emergency Room for evaluation. He voiced his understanding of this as well. I gave him my direct number to call with any further questions or concerns.

## 2018-01-01 ENCOUNTER — Telehealth: Payer: Self-pay | Admitting: *Deleted

## 2018-01-01 ENCOUNTER — Emergency Department (HOSPITAL_COMMUNITY)
Admission: EM | Admit: 2018-01-01 | Discharge: 2018-01-02 | Disposition: A | Discharge status: Home / Self Care | Payer: Medicare Other | Attending: Emergency Medicine | Admitting: Emergency Medicine

## 2018-01-01 ENCOUNTER — Encounter (HOSPITAL_COMMUNITY): Payer: Self-pay | Admitting: *Deleted

## 2018-01-01 ENCOUNTER — Other Ambulatory Visit: Payer: Self-pay

## 2018-01-01 ENCOUNTER — Other Ambulatory Visit (HOSPITAL_COMMUNITY): Payer: Self-pay | Admitting: Radiation Oncology

## 2018-01-01 DIAGNOSIS — R531 Weakness: Secondary | ICD-10-CM | POA: Insufficient documentation

## 2018-01-01 DIAGNOSIS — I10 Essential (primary) hypertension: Secondary | ICD-10-CM | POA: Insufficient documentation

## 2018-01-01 DIAGNOSIS — D649 Anemia, unspecified: Secondary | ICD-10-CM | POA: Diagnosis not present

## 2018-01-01 DIAGNOSIS — Z87891 Personal history of nicotine dependence: Secondary | ICD-10-CM | POA: Insufficient documentation

## 2018-01-01 DIAGNOSIS — Z79899 Other long term (current) drug therapy: Secondary | ICD-10-CM | POA: Diagnosis not present

## 2018-01-01 DIAGNOSIS — R1310 Dysphagia, unspecified: Secondary | ICD-10-CM

## 2018-01-01 LAB — CBC WITH DIFFERENTIAL/PLATELET
ABS IMMATURE GRANULOCYTES: 0 10*3/uL (ref 0.0–0.1)
Basophils Absolute: 0 10*3/uL (ref 0.0–0.1)
Basophils Relative: 1 %
Eosinophils Absolute: 0.1 10*3/uL (ref 0.0–0.7)
Eosinophils Relative: 2 %
HEMATOCRIT: 27.2 % — AB (ref 39.0–52.0)
Hemoglobin: 8.4 g/dL — ABNORMAL LOW (ref 13.0–17.0)
IMMATURE GRANULOCYTES: 0 %
LYMPHS ABS: 0.9 10*3/uL (ref 0.7–4.0)
LYMPHS PCT: 14 %
MCH: 29.3 pg (ref 26.0–34.0)
MCHC: 30.9 g/dL (ref 30.0–36.0)
MCV: 94.8 fL (ref 78.0–100.0)
MONOS PCT: 7 %
Monocytes Absolute: 0.5 10*3/uL (ref 0.1–1.0)
NEUTROS ABS: 5 10*3/uL (ref 1.7–7.7)
NEUTROS PCT: 76 %
Platelets: 254 10*3/uL (ref 150–400)
RBC: 2.87 MIL/uL — ABNORMAL LOW (ref 4.22–5.81)
RDW: 16.8 % — ABNORMAL HIGH (ref 11.5–15.5)
WBC: 6.6 10*3/uL (ref 4.0–10.5)

## 2018-01-01 LAB — FOLATE: FOLATE: 9.7 ng/mL (ref >5.9)

## 2018-01-01 LAB — BASIC METABOLIC PANEL
ANION GAP: 9 (ref 5–15)
BUN: 20 mg/dL (ref 6–20)
CALCIUM: 9.3 mg/dL (ref 8.9–10.3)
CO2: 25 mmol/L (ref 22–32)
Chloride: 103 mmol/L (ref 101–111)
Creatinine, Ser: 1.27 mg/dL — ABNORMAL HIGH (ref 0.61–1.24)
GFR, EST AFRICAN AMERICAN: 59 mL/min — AB (ref >60)
GFR, EST NON AFRICAN AMERICAN: 51 mL/min — AB (ref >60)
GLUCOSE: 98 mg/dL (ref 65–99)
POTASSIUM: 4.4 mmol/L (ref 3.5–5.1)
Sodium: 137 mmol/L (ref 135–145)

## 2018-01-01 LAB — RETICULOCYTES
RBC.: 2.87 MIL/uL — ABNORMAL LOW (ref 4.22–5.81)
RETIC COUNT ABSOLUTE: 54.5 10*3/uL (ref 19.0–186.0)
Retic Ct Pct: 1.9 % (ref 0.4–3.1)

## 2018-01-01 LAB — IRON AND TIBC
IRON: 40 ug/dL — AB (ref 45–182)
Saturation Ratios: 19 % (ref 17.9–39.5)
TIBC: 211 ug/dL — AB (ref 250–450)
UIBC: 171 ug/dL

## 2018-01-01 LAB — VITAMIN B12: Vitamin B-12: 236 pg/mL (ref 180–914)

## 2018-01-01 LAB — FERRITIN: FERRITIN: 406 ng/mL — AB (ref 24–336)

## 2018-01-01 NOTE — ED Triage Notes (Signed)
Pt in stating he has iron deficiency anemia and his levels are low, they attempted to schedule an iron transfusion and possible blood transfusion on an outpatient basis but they were unable to get an appointment and patient reports he is feeling weaker, advised to come to ED for transfusions, no distress noted

## 2018-01-01 NOTE — ED Provider Notes (Signed)
[facility] EMERGENCY DEPARTMENT Provider Note   CSN: [id number] Arrival date & time: 01/01/18  1543     History   Chief Complaint Chief Complaint  Patient presents with  . Abnormal Lab    HPI Derrick Ayala is a 83 y.o. male.  HPI   He is here for evaluation of weakness which is worse with exertion and walking.  Symptom onset several weeks ago.  2 weeks ago he had a fall and was evaluated by his PCP and told that he had anemia.  He is also been having problems with mucus in his throat, which is concerning, to him, or problems related to the radiation that he had for throat cancer, for years ago.  He occasionally coughs up brown sputum, and has trouble swallowing.  He feels like he is losing weight.  He recently had both upper and lower endoscopy to evaluate for bleeding but was found to be normal.  He has not had any visualized blood loss.  He denies fever, chills, nausea, vomiting, focal weakness or paresthesia.  There are no other recent falls.  There are no other known modifying factors.    Past Medical History:  Diagnosis Date  . Cancer ([place]) 08/22/13 bx   left vocal cord,  . Hypertension   . Hypokalemia   . 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 Unspecified essential hypertension 11/01/2013  . Hypokalemia 10/22/2013  . 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 COLONOSCOPY WITH PROPOFOL N/A 10/19/2017   Procedure: COLONOSCOPY WITH PROPOFOL;  Surgeon: Laurence Spates, MD;  Location: [facility];  Service: Endoscopy;  Laterality: N/A;  . ESOPHAGOGASTRODUODENOSCOPY (EGD) WITH PROPOFOL N/A 10/19/2017   Procedure: ESOPHAGOGASTRODUODENOSCOPY (EGD) WITH PROPOFOL;  Surgeon: Laurence Spates, MD;  Location: [facility];  Service:  Endoscopy;  Laterality: N/A;  . HERNIA REPAIR     12-13 yrs ago        Home Medications    Prior to Admission medications   Medication Sig Start Date End Date Taking? Authorizing Provider  betamethasone dipropionate (DIPROLENE) 0.05 % ointment Apply 1 application topically 2 (two) times daily.   Yes [provider]  Diphenhydramine-PE-APAP (NIGHTTIME COLD & FLU MAX STR PO) Take 1 tablet by mouth at bedtime as needed (for congestion).   Yes [provider]  folic acid (FOLVITE) 1 MG tablet Take 1 mg by mouth daily. 12/29/17  Yes [provider]  irbesartan (AVAPRO) 300 MG tablet Take 300 mg by mouth daily.   Yes [provider]  methotrexate 2.5 MG tablet Take 15 mg by mouth every Saturday. 07/07/17  Yes [provider]  metoprolol tartrate (LOPRESSOR) 25 MG tablet Take 25 mg by mouth 2 (two) times daily. 12/22/17  Yes [provider]  pseudoephedrine (SUDAFED) 120 MG 12 hr tablet Take 120 mg by mouth daily as needed for congestion.   Yes [provider]  sildenafil (VIAGRA) 100 MG tablet Take 100 mg by mouth as needed for erectile dysfunction.   Yes [provider]  sodium chloride (OCEAN) 0.65 % SOLN nasal spray Place 2 sprays into both nostrils daily.   Yes [provider]  tamsulosin (FLOMAX) 0.4 MG CAPS capsule Take 0.4 mg by mouth daily after breakfast.  Yes [provider]  triamcinolone cream (KENALOG) 0.1 % Apply 1 application topically 2 (two) times daily.    Yes [provider]    Family History Family History  Problem Relation Age of Onset  . Cancer Mother        unknown  . Cancer Brother        lung ca, smoker    Social History Social History   Tobacco Use  . Smoking status: Former Smoker    Packs/day: 0.25    Years: 12.00    Pack years: 3.00    Last attempt to quit: 07/02/1999    Years since quitting: 18.5  . Smokeless tobacco: Former Systems developer    Types: Chew    Quit  date: 09/04/2013  Substance Use Topics  . Alcohol use: Yes    Comment: 1 beer or shot vodka or gin occasionally  . Drug use: Not on file     Allergies   Patient has no known allergies.   Review of Systems Review of Systems  All other systems reviewed and are negative.    Physical Exam Updated Vital Signs BP 113/74   Pulse (!) 48   Temp 97.9 F (36.6 C) (Oral)   Resp 16   SpO2 100%   Physical Exam  Constitutional: He is oriented to person, place, and time. He appears well-developed. No distress.  Elderly frail  HENT:  Head: Normocephalic and atraumatic.  Right Ear: External ear normal.  Left Ear: External ear normal.  Eyes: Pupils are equal, round, and reactive to light. Conjunctivae and EOM are normal.  Neck: Normal range of motion and phonation normal. Neck supple.  Cardiovascular: Normal rate, regular rhythm and normal heart sounds.  Pulmonary/Chest: Effort normal and breath sounds normal. No respiratory distress. He exhibits no bony tenderness.  Abdominal: Soft. There is no tenderness.  Musculoskeletal: Normal range of motion. He exhibits no edema.  Neurological: He is alert and oriented to person, place, and time. No cranial nerve deficit or sensory deficit. He exhibits normal muscle tone. Coordination normal.  Skin: Skin is warm, dry and intact.  Psychiatric: He has a normal mood and affect. His behavior is normal. Judgment and thought content normal.  Nursing note and vitals reviewed.    ED Treatments / Results  Labs (all labs ordered are listed, but only abnormal results are displayed) Labs Reviewed  CBC WITH DIFFERENTIAL/PLATELET - Abnormal; Notable for the following components:      Result Value   RBC 2.87 (*)    Hemoglobin 8.4 (*)    HCT 27.2 (*)    RDW 16.8 (*)    All other components within normal limits  BASIC METABOLIC PANEL - Abnormal; Notable for the following components:   Creatinine, Ser 1.27 (*)    GFR calc non Af Amer 51 (*)    GFR calc  Af Amer 59 (*)    All other components within normal limits  IRON AND TIBC - Abnormal; Notable for the following components:   Iron 40 (*)    TIBC 211 (*)    All other components within normal limits  FERRITIN - Abnormal; Notable for the following components:   Ferritin 406 (*)    All other components within normal limits  RETICULOCYTES - Abnormal; Notable for the following components:   RBC. 2.87 (*)    All other components within normal limits  VITAMIN B12  FOLATE    EKG None  Radiology No results found.  Procedures Procedures (including critical  care time)  Medications Ordered in ED Medications - No data to display   Initial Impression / Assessment and Plan / ED Course  I have reviewed the triage vital signs and the nursing notes.  Pertinent labs & imaging results that were available during my care of the patient were reviewed by me and considered in my medical decision making (see chart for details).  Clinical Course as of Jan 09 1214  Mon Jan 01, 2018  2217 high  Ferritin(!): 406 [EW]  2219 Normal except creat high  Basic metabolic panel(!) [EW]  6237 Abnormal, both low  Iron and TIBC(!) [EW]  2220 Normal except hemoglobin is low  CBC with Differential(!) [EW]    Clinical Course User Index [EW] Daleen Bo, MD     No data found.  At discharge- reevaluation with update and discussion. After initial assessment and treatment, an updated evaluation reveals he remains comfortable and has no further complaints.  Findings discussed with patient and family member, all questions answered. Daleen Bo   Medical Decision Making: Anemia, likely related to chronic disease.  No evident acute blood loss, hemodynamic instability, or urgent need for blood transfusion.  Hemoglobin is greater than 7, a usual point of transfusion.  Therefore the patient is not being transfused on this visit.  He has access to care, follow-up and treatment as an outpatient.  Doubt ACS, PE  or pneumonia.  Doubt impending vascular collapse.  CRITICAL CARE-no Performed by: Daleen Bo   Nursing Notes Reviewed/ Care Coordinated Applicable Imaging Reviewed Interpretation of Laboratory Data incorporated into ED treatment  The patient appears reasonably screened and/or stabilized for discharge and I doubt any other medical condition or other [facility] requiring further screening, evaluation, or treatment in the ED at this time prior to discharge.  Plan: Home Medications-usual medications; Home Treatments-rest, fluids; return here if the recommended treatment, does not improve the symptoms; Recommended follow up-PCP/hematology oncology, check in 1 week and as needed.     Final Clinical Impressions(s) / ED Diagnoses   Final diagnoses:  Symptomatic anemia    ED Discharge Orders    None       Daleen Bo, MD 01/09/18 1220

## 2018-01-01 NOTE — Telephone Encounter (Signed)
CALLED PATIENT TO INFORM OF MBS ON 01-10-18 - ARRIVAL TIME- 12:45 PM @ [facility], NO RESTRICTIONS TO TEST, LVM FOR A RETURN CALL

## 2018-01-01 NOTE — ED Provider Notes (Signed)
Patient placed in Quick Look pathway, seen and evaluated   Chief Complaint: Anemia  HPI:   Patient reports he was sent here by his PCP for blood transfusion due to iron deficiency anemia.  States he was unable to get an appointment to transfuse on outpatient basis and was recommended to report to the ED.  Reports he feels generally weak.  Not on anticoagulation.  ROS: + weakness  Physical Exam:   Gen: No distress  Neuro: Awake and Alert  Skin: Warm    Focused Exam: RRR, lungs CTAB   Initiation of care has begun. The patient has been counseled on the process, plan, and necessity for staying for the completion/evaluation, and the remainder of the medical screening examination    Robinson, [place] N, PA-C 01/01/18 1557    Daleen Bo, MD 01/01/18 2147

## 2018-01-02 NOTE — Discharge Instructions (Addendum)
Call your radiation oncologist, who is working on a referral to hematology.  Asked them when that referral will be done, and when you can get an appointment with the hematologist.  Make sure that you are drinking plenty of fluids, and taking iron pills with a stool softener.

## 2018-01-05 ENCOUNTER — Telehealth: Payer: Self-pay | Admitting: *Deleted

## 2018-01-05 NOTE — Telephone Encounter (Signed)
CALLED PATIENT TO INFORM OF APPT. WITH DR. KALE ON 01-11-18 @ 11:20 AM, SPOKE WITH PATIENT AND HE IS AWARE OF THIS APPT.

## 2018-01-10 ENCOUNTER — Encounter (HOSPITAL_COMMUNITY): Payer: Medicare Other

## 2018-01-10 ENCOUNTER — Ambulatory Visit (HOSPITAL_COMMUNITY): Payer: Medicare Other

## 2018-01-11 ENCOUNTER — Encounter: Payer: Medicare Other | Admitting: Hematology

## 2018-01-18 ENCOUNTER — Encounter (INDEPENDENT_AMBULATORY_CARE_PROVIDER_SITE_OTHER): Payer: Self-pay

## 2018-01-18 ENCOUNTER — Encounter: Payer: Self-pay | Admitting: Cardiology

## 2018-01-18 ENCOUNTER — Ambulatory Visit: Payer: Medicare Other | Admitting: Cardiology

## 2018-01-18 ENCOUNTER — Encounter: Payer: Self-pay | Admitting: *Deleted

## 2018-01-18 VITALS — BP 124/66 | HR 87 | Ht 70.0 in | Wt 180.4 lb

## 2018-01-18 DIAGNOSIS — I4510 Unspecified right bundle-branch block: Secondary | ICD-10-CM

## 2018-01-18 DIAGNOSIS — I4891 Unspecified atrial fibrillation: Secondary | ICD-10-CM

## 2018-01-18 DIAGNOSIS — I10 Essential (primary) hypertension: Secondary | ICD-10-CM | POA: Diagnosis not present

## 2018-01-18 HISTORY — DX: Unspecified right bundle-branch block: I45.10

## 2018-01-18 HISTORY — DX: Unspecified atrial fibrillation: I48.91

## 2018-01-18 NOTE — Patient Instructions (Addendum)
Medication Instructions:  Your physician recommends that you continue on your current medications as directed. Please refer to the Current Medication list given to you today.   Labwork: None ordered  Testing/Procedures: Your physician has requested that you have a lexiscan myoview. For further information please visit [email or web address]. Please follow instruction sheet, as given.  Your physician has requested that you have an echocardiogram. Echocardiography is a painless test that uses sound waves to create images of your heart. It provides your doctor with information about the size and shape of your heart and how well your heart's chambers and valves are working. This procedure takes approximately one hour. There are no restrictions for this procedure.   Follow-Up: Your physician recommends that you schedule a follow-up appointment in: 02/15/18 ARRIVE AT 11:15 TO SEE BRITTANY SIMMONS, PA-C  Any Other Special Instructions Will Be Listed Below (If Applicable).  Cardiac Nuclear Scan A cardiac nuclear scan is a test that measures blood flow to the heart when a person is resting and when he or she is exercising. The test looks for problems such as:  Not enough blood reaching a portion of the heart.  The heart muscle not working normally.  You may need this test if:  You have heart disease.  You have had abnormal lab results.  You have had heart surgery or angioplasty.  You have chest pain.  You have shortness of breath.  In this test, a radioactive dye (tracer) is injected into your bloodstream. After the tracer has traveled to your heart, an imaging device is used to measure how much of the tracer is absorbed by or distributed to various areas of your heart. This procedure is usually done at a hospital and takes 2-4 hours. Tell a health care provider about:  Any allergies you have.  All medicines you are taking, including vitamins, herbs, eye drops, creams, and  over-the-counter medicines.  Any problems you or family members have had with the use of anesthetic medicines.  Any blood disorders you have.  Any surgeries you have had.  Any medical conditions you have.  Whether you are pregnant or may be pregnant. What are the risks? Generally, this is a safe procedure. However, problems may occur, including:  Serious chest pain and heart attack. This is only a risk if the stress portion of the test is done.  Rapid heartbeat.  Sensation of warmth in your chest. This usually passes quickly.  What happens before the procedure?  Ask your health care provider about changing or stopping your regular medicines. This is especially important if you are taking diabetes medicines or blood thinners.  Remove your jewelry on the day of the procedure. What happens during the procedure?  An IV tube will be inserted into one of your veins.  Your health care provider will inject a small amount of radioactive tracer through the tube.  You will wait for 20-40 minutes while the tracer travels through your bloodstream.  Your heart activity will be monitored with an electrocardiogram (ECG).  You will lie down on an exam table.  Images of your heart will be taken for about 15-20 minutes.  You may be asked to exercise on a treadmill or stationary bike. While you exercise, your heart's activity will be monitored with an ECG, and your blood pressure will be checked. If you are unable to exercise, you may be given a medicine to increase blood flow to parts of your heart.  When blood flow to your heart has peaked,  a tracer will again be injected through the IV tube.  After 20-40 minutes, you will get back on the exam table and have more images taken of your heart.  When the procedure is over, your IV tube will be removed. The procedure may vary among health care providers and hospitals. Depending on the type of tracer used, scans may need to be repeated 3-4  hours later. What happens after the procedure?  Unless your health care provider tells you otherwise, you may return to your normal schedule, including diet, activities, and medicines.  Unless your health care provider tells you otherwise, you may increase your fluid intake. This will help flush the contrast dye from your body. Drink enough fluid to keep your urine clear or pale yellow.  It is up to you to get your test results. Ask your health care provider, or the department that is doing the test, when your results will be ready. Summary  A cardiac nuclear scan measures the blood flow to the heart when a person is resting and when he or she is exercising.  You may need this test if you are at risk for heart disease.  Tell your health care provider if you are pregnant.  Unless your health care provider tells you otherwise, increase your fluid intake. This will help flush the contrast dye from your body. Drink enough fluid to keep your urine clear or pale yellow. This information is not intended to replace advice given to you by your health care provider. Make sure you discuss any questions you have with your health care provider. Document Released: 08/12/2004 Document Revised: 07/20/2016 Document Reviewed: 06/26/2013 Elsevier Interactive Patient Education  2017 [place].  Echocardiogram An echocardiogram, or echocardiography, uses sound waves (ultrasound) to produce an image of your heart. The echocardiogram is simple, painless, obtained within a short period of time, and offers valuable information to your health care provider. The images from an echocardiogram can provide information such as:  Evidence of coronary artery disease (CAD).  Heart size.  Heart muscle function.  Heart valve function.  Aneurysm detection.  Evidence of a past heart attack.  Fluid buildup around the heart.  Heart muscle thickening.  Assess heart valve function.  Tell a health care provider  about:  Any allergies you have.  All medicines you are taking, including vitamins, herbs, eye drops, creams, and over-the-counter medicines.  Any problems you or family members have had with anesthetic medicines.  Any blood disorders you have.  Any surgeries you have had.  Any medical conditions you have.  Whether you are pregnant or may be pregnant. What happens before the procedure? No special preparation is needed. Eat and drink normally. What happens during the procedure?  In order to produce an image of your heart, gel will be applied to your chest and a wand-like tool (transducer) will be moved over your chest. The gel will help transmit the sound waves from the transducer. The sound waves will harmlessly bounce off your heart to allow the heart images to be captured in real-time motion. These images will then be recorded.  You may need an IV to receive a medicine that improves the quality of the pictures. What happens after the procedure? You may return to your normal schedule including diet, activities, and medicines, unless your health care provider tells you otherwise. This information is not intended to replace advice given to you by your health care provider. Make sure you discuss any questions you have with your  health care provider. Document Released: 07/15/2000 Document Revised: 03/05/2016 Document Reviewed: 03/25/2013 Elsevier Interactive Patient Education  2017 [employer].   If you need a refill on your cardiac medications before your next appointment, please call your pharmacy.

## 2018-01-18 NOTE — Progress Notes (Signed)
Cardiology Office Note    Date:  01/18/2018   ID:  Cincere Zorn, DOB [birth year], MRN [id number]  PCP:  Lujean Amel, MD  Cardiologist:  Fransico Him, MD   Chief Complaint  Patient presents with  . Atrial Fibrillation  . Hypertension    History of Present Illness:  Derrick Ayala is a 83 y.o. male who is being seen today for the evaluation of new onset atrial fibrillation at the request of Koirala, Dibas, MD.  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  He was also found to have new onset atrial fibrillation and is now here for further evaluation.  He is here today for followup and is doing well.  He denies any chest pain or pressure, SOB, DOE, PND, orthopnea, LE edema, dizziness, palpitations or syncope. He is compliant with his meds and is tolerating meds with no SE.    Past Medical History:  Diagnosis Date  . Cancer ([place]) 08/22/13 bx   left vocal cord,  . Hypertension   . Hypokalemia   . New onset atrial fibrillation ([place]) 01/18/2018  . RBBB 01/18/2018  . 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 COLONOSCOPY WITH PROPOFOL N/A 10/19/2017   Procedure: COLONOSCOPY WITH PROPOFOL;  Surgeon: Laurence Spates, MD;  Location: [facility];  Service: Endoscopy;  Laterality: N/A;  . ESOPHAGOGASTRODUODENOSCOPY (EGD) WITH PROPOFOL N/A 10/19/2017   Procedure: ESOPHAGOGASTRODUODENOSCOPY (EGD) WITH PROPOFOL;  Surgeon: Laurence Spates, MD;  Location: [facility];  Service: Endoscopy;  Laterality: N/A;  . HERNIA REPAIR     12-13 yrs ago    Current Medications: Current Meds  Medication Sig  . betamethasone dipropionate (DIPROLENE) 0.05 % ointment Apply 1 application topically 2  (two) times daily.  . Diphenhydramine-PE-APAP (NIGHTTIME COLD & FLU MAX STR PO) Take 1 tablet by mouth at bedtime as needed (for congestion).  . folic acid (FOLVITE) 1 MG tablet Take 1 mg by mouth daily.  . irbesartan (AVAPRO) 300 MG tablet Take 300 mg by mouth daily.  . methotrexate 2.5 MG tablet Take 15 mg by mouth every Saturday.  . metoprolol tartrate (LOPRESSOR) 25 MG tablet Take 25 mg by mouth 2 (two) times daily.  . pseudoephedrine (SUDAFED) 120 MG 12 hr tablet Take 120 mg by mouth daily as needed for congestion.  . sildenafil (VIAGRA) 100 MG tablet Take 100 mg by mouth as needed for erectile dysfunction.  . sodium chloride (OCEAN) 0.65 % SOLN nasal spray Place 2 sprays into both nostrils daily.  . tamsulosin (FLOMAX) 0.4 MG CAPS capsule Take 0.4 mg by mouth daily after breakfast.  . triamcinolone cream (KENALOG) 0.1 % Apply 1 application topically 2 (two) times daily.     Allergies:   Patient has no known allergies.   Social History   Socioeconomic History  . Marital status: Married    Spouse name: Not on file  . Number of children: 1  . Years of education: Not on file  . Highest education level: Not on file  Occupational History  . Occupation: Rtired    Comment: Corporate investment banker  . Financial resource strain: Not on file  .  Food insecurity:    Worry: Not on file    Inability: Not on file  . Transportation needs:    Medical: Not on file    Non-medical: Not on file  Tobacco Use  . Smoking status: Former Smoker    Packs/day: 0.25    Years: 12.00    Pack years: 3.00    Last attempt to quit: 07/02/1999    Years since quitting: 18.5  . Smokeless tobacco: Former Systems developer    Types: [place] date: 09/04/2013  Substance and Sexual Activity  . Alcohol use: Yes    Comment: 1 beer or shot vodka or gin occasionally  . Drug use: Not on file  . Sexual activity: Yes    Birth control/protection: None  Lifestyle  . Physical activity:    Days per week: Not on  file    Minutes per session: Not on file  . Stress: Not on file  Relationships  . Social connections:    Talks on phone: Not on file    Gets together: Not on file    Attends religious service: Not on file    Active member of club or organization: Not on file    Attends meetings of clubs or organizations: Not on file    Relationship status: Not on file  Other Topics Concern  . Not on file  Social History Narrative  . Not on file     Family History:  The patient's family history includes Cancer in his brother and mother.   ROS:   Please see the history of present illness.    ROS All other systems reviewed and are negative.  No flowsheet data found.     PHYSICAL EXAM:   VS:  BP 124/66   Pulse 87   Ht 5\' 10"  (1.778 m)   Wt 180 lb 6.4 oz (81.8 kg)   BMI 25.88 kg/m    GEN: Well nourished, well developed, in no acute distress  HEENT: normal  Neck: no JVD, carotid bruits, or masses Cardiac: RRR; no murmurs, rubs, or gallops,no edema.  Intact distal pulses bilaterally.  Respiratory:  clear to auscultation bilaterally, normal work of breathing GI: soft, nontender, nondistended, + BS MS: no deformity or atrophy  Skin: warm and dry, no rash Neuro:  Alert and Oriented x 3, Strength and sensation are intact Psych: euthymic mood, full affect  Wt Readings from Last 3 Encounters:  01/18/18 180 lb 6.4 oz (81.8 kg)  10/19/17 185 lb (83.9 kg)  07/28/17 194 lb 9.6 oz (88.3 kg)      Studies/Labs Reviewed:   EKG:  EKG is ordered today.  The ekg ordered today demonstrates atrial fibrillation/flutter with right bundle branch block  Recent Labs: 07/28/2017: TSH 1.837 01/01/2018: BUN 20; Creatinine, Ser 1.27; Hemoglobin 8.4; Platelets 254; Potassium 4.4; Sodium 137   Lipid Panel No results found for: CHOL, TRIG, HDL, CHOLHDL, VLDL, LDLCALC, LDLDIRECT  Additional studies/ records that were reviewed today include:  Office notes and ER notes    ASSESSMENT:    1. New onset  atrial fibrillation ([place])   2. Essential hypertension   3. RBBB      PLAN:  In order of problems listed above:  1.  New onset atrial fibrillation - he has had some exertional weakness but otherwise if asymptomatic.  He was placed on Eliquis 5mg  BID by his PCP.  His CHADS2VASC score is 3 (age>75 -2, HTN - 1).  His Creatinine is 1.27 and Hbg 8.4.  His HR is controlled on Lopressor 25mg  BID.  I will have my extender see him back in 4 weeks.  If he has not missed any doses of Eliquis and remains in NSR, then will set up for DCCV.  Suspect that some of his exertional weakness and dyspnea on exertion are related to his A. fib in combination with anemia.  2.  HTN - BP controlled on exam today.  Continue Lopressor 25mg  BID and irbesartan 300mg  daily  3.  RBBB - I will get a 2D echo to assess LVF to assess LVF and Lexiscan myoview to rule out ischemia.   4.  Anemia - ? Chronic disease.  GI workup unremarkable. PCP following.     Medication Adjustments/Labs and Tests Ordered: Current medicines are reviewed at length with the patient today.  Concerns regarding medicines are outlined above.  Medication changes, Labs and Tests ordered today are listed in the Patient Instructions below.  There are no Patient Instructions on file for this visit.   Signed, Fransico Him, MD  01/18/2018 1:42 PM    [facility] [address], [place], [place]  [address] Phone: [phone]; Fax: [phone]

## 2018-01-23 ENCOUNTER — Telehealth (HOSPITAL_COMMUNITY): Payer: Self-pay | Admitting: *Deleted

## 2018-01-23 NOTE — Telephone Encounter (Signed)
Patient called in reference to upcoming appointment scheduled for 01/29/18.Patient stated he may need to r/s this appointment. He will call us back tomorrow. Phone number given for a call back so details instructions can be given. Bennetta Rudden, Ranae Palms

## 2018-01-25 ENCOUNTER — Telehealth: Payer: Self-pay

## 2018-01-25 ENCOUNTER — Telehealth (HOSPITAL_COMMUNITY): Payer: Self-pay | Admitting: Cardiology

## 2018-01-25 NOTE — Telephone Encounter (Signed)
Will assess at OV with Lyda Jester - please make sure that he knows that he needs to followup

## 2018-01-25 NOTE — Telephone Encounter (Signed)
Advised pt per Dr. Radford Pax to be sure he keeps follow up with B. Simmons on 02/15/18. Pt verbalized understanding but noted based on his IV iron appt he may need to reschedule. I advised him to talk with Dr. Pearlie Oyster office and maybe they can work around it. Pt will let us know prior to appt if he can make it.

## 2018-01-25 NOTE — Telephone Encounter (Signed)
Spoke with patient concerning his upcoming appointment. Will mail letter and calender . Per 6/27 sch msg

## 2018-01-25 NOTE — Telephone Encounter (Signed)
NEW MESSAGE: I noticed that the patient called into the office on 01/24/18 and cancelled both his echo and nuclear appointments. I spoke with him to see if he would like to reschedule these appointments for a day prior to following up with Lyda Jester on 02/15/18. He stated that he was seen at [facility] yesterday for iron deficiency and his doctor would be setting up an appointment to receive "iron fluids". If he needed to reschedule these appointments he would call back to the office.   Please follow up with patient to solidify a treatment plan.  Thanks

## 2018-01-26 ENCOUNTER — Telehealth: Payer: Self-pay

## 2018-01-26 NOTE — Telephone Encounter (Signed)
Called patient and phone was no longer in service. Per 6/27 sch msg completed and calender, and letter enclosed will be  mailed

## 2018-01-29 ENCOUNTER — Other Ambulatory Visit (HOSPITAL_COMMUNITY): Payer: Medicare Other

## 2018-01-29 ENCOUNTER — Encounter (HOSPITAL_COMMUNITY): Payer: Medicare Other

## 2018-02-14 ENCOUNTER — Encounter: Payer: Medicare Other | Admitting: Hematology

## 2018-02-15 ENCOUNTER — Ambulatory Visit: Payer: Medicare Other | Admitting: Cardiology

## 2018-03-07 ENCOUNTER — Encounter (HOSPITAL_COMMUNITY): Payer: Self-pay | Admitting: Radiology

## 2018-03-07 ENCOUNTER — Encounter: Payer: Self-pay | Admitting: Cardiology

## 2018-04-09 ENCOUNTER — Ambulatory Visit
Admission: RE | Admit: 2018-04-09 | Discharge: 2018-04-09 | Disposition: A | Discharge status: Home / Self Care | Payer: Medicare Other | Source: Ambulatory Visit | Attending: Family Medicine | Admitting: Family Medicine

## 2018-04-09 ENCOUNTER — Other Ambulatory Visit: Payer: Self-pay | Admitting: Family Medicine

## 2018-04-09 DIAGNOSIS — R600 Localized edema: Secondary | ICD-10-CM

## 2018-04-10 ENCOUNTER — Other Ambulatory Visit: Payer: Self-pay | Admitting: Family Medicine

## 2018-04-10 DIAGNOSIS — J90 Pleural effusion, not elsewhere classified: Secondary | ICD-10-CM

## 2018-04-11 ENCOUNTER — Ambulatory Visit
Admission: RE | Admit: 2018-04-11 | Discharge: 2018-04-11 | Disposition: A | Discharge status: Home / Self Care | Payer: Medicare Other | Source: Ambulatory Visit | Attending: Family Medicine | Admitting: Family Medicine

## 2018-04-11 DIAGNOSIS — J90 Pleural effusion, not elsewhere classified: Secondary | ICD-10-CM

## 2018-04-12 ENCOUNTER — Inpatient Hospital Stay (HOSPITAL_COMMUNITY)
Admission: EM | Admit: 2018-04-12 | Discharge: 2018-05-01 | DRG: 177 | Disposition: E | Payer: Medicare Other | Attending: Pulmonary Disease | Admitting: Pulmonary Disease

## 2018-04-12 ENCOUNTER — Other Ambulatory Visit: Payer: Self-pay

## 2018-04-12 ENCOUNTER — Encounter (HOSPITAL_COMMUNITY): Payer: Self-pay

## 2018-04-12 DIAGNOSIS — A419 Sepsis, unspecified organism: Secondary | ICD-10-CM | POA: Diagnosis not present

## 2018-04-12 DIAGNOSIS — R570 Cardiogenic shock: Secondary | ICD-10-CM

## 2018-04-12 DIAGNOSIS — Z7901 Long term (current) use of anticoagulants: Secondary | ICD-10-CM | POA: Diagnosis not present

## 2018-04-12 DIAGNOSIS — J189 Pneumonia, unspecified organism: Secondary | ICD-10-CM | POA: Diagnosis present

## 2018-04-12 DIAGNOSIS — R918 Other nonspecific abnormal finding of lung field: Secondary | ICD-10-CM | POA: Diagnosis not present

## 2018-04-12 DIAGNOSIS — N400 Enlarged prostate without lower urinary tract symptoms: Secondary | ICD-10-CM | POA: Diagnosis present

## 2018-04-12 DIAGNOSIS — G931 Anoxic brain damage, not elsewhere classified: Secondary | ICD-10-CM | POA: Diagnosis not present

## 2018-04-12 DIAGNOSIS — R6521 Severe sepsis with septic shock: Secondary | ICD-10-CM | POA: Diagnosis not present

## 2018-04-12 DIAGNOSIS — E440 Moderate protein-calorie malnutrition: Secondary | ICD-10-CM | POA: Diagnosis present

## 2018-04-12 DIAGNOSIS — Z515 Encounter for palliative care: Secondary | ICD-10-CM | POA: Diagnosis not present

## 2018-04-12 DIAGNOSIS — Z681 Body mass index (BMI) 19 or less, adult: Secondary | ICD-10-CM | POA: Diagnosis not present

## 2018-04-12 DIAGNOSIS — R739 Hyperglycemia, unspecified: Secondary | ICD-10-CM | POA: Diagnosis present

## 2018-04-12 DIAGNOSIS — Z923 Personal history of irradiation: Secondary | ICD-10-CM

## 2018-04-12 DIAGNOSIS — E876 Hypokalemia: Secondary | ICD-10-CM | POA: Diagnosis present

## 2018-04-12 DIAGNOSIS — J850 Gangrene and necrosis of lung: Secondary | ICD-10-CM | POA: Diagnosis present

## 2018-04-12 DIAGNOSIS — C320 Malignant neoplasm of glottis: Secondary | ICD-10-CM | POA: Diagnosis present

## 2018-04-12 DIAGNOSIS — I5021 Acute systolic (congestive) heart failure: Secondary | ICD-10-CM | POA: Diagnosis not present

## 2018-04-12 DIAGNOSIS — I4510 Unspecified right bundle-branch block: Secondary | ICD-10-CM | POA: Diagnosis present

## 2018-04-12 DIAGNOSIS — Z87891 Personal history of nicotine dependence: Secondary | ICD-10-CM

## 2018-04-12 DIAGNOSIS — K5900 Constipation, unspecified: Secondary | ICD-10-CM | POA: Diagnosis present

## 2018-04-12 DIAGNOSIS — Z4659 Encounter for fitting and adjustment of other gastrointestinal appliance and device: Secondary | ICD-10-CM

## 2018-04-12 DIAGNOSIS — Z8521 Personal history of malignant neoplasm of larynx: Secondary | ICD-10-CM | POA: Diagnosis not present

## 2018-04-12 DIAGNOSIS — I4891 Unspecified atrial fibrillation: Secondary | ICD-10-CM | POA: Diagnosis present

## 2018-04-12 DIAGNOSIS — J9601 Acute respiratory failure with hypoxia: Secondary | ICD-10-CM | POA: Diagnosis not present

## 2018-04-12 DIAGNOSIS — Z66 Do not resuscitate: Secondary | ICD-10-CM | POA: Diagnosis not present

## 2018-04-12 DIAGNOSIS — I482 Chronic atrial fibrillation: Secondary | ICD-10-CM | POA: Diagnosis present

## 2018-04-12 DIAGNOSIS — I472 Ventricular tachycardia: Secondary | ICD-10-CM | POA: Diagnosis not present

## 2018-04-12 DIAGNOSIS — I340 Nonrheumatic mitral (valve) insufficiency: Secondary | ICD-10-CM | POA: Diagnosis not present

## 2018-04-12 DIAGNOSIS — Z801 Family history of malignant neoplasm of trachea, bronchus and lung: Secondary | ICD-10-CM

## 2018-04-12 DIAGNOSIS — I10 Essential (primary) hypertension: Secondary | ICD-10-CM | POA: Diagnosis not present

## 2018-04-12 DIAGNOSIS — I110 Hypertensive heart disease with heart failure: Secondary | ICD-10-CM | POA: Diagnosis present

## 2018-04-12 DIAGNOSIS — D6489 Other specified anemias: Secondary | ICD-10-CM | POA: Diagnosis not present

## 2018-04-12 DIAGNOSIS — Z781 Physical restraint status: Secondary | ICD-10-CM

## 2018-04-12 DIAGNOSIS — I5043 Acute on chronic combined systolic (congestive) and diastolic (congestive) heart failure: Secondary | ICD-10-CM | POA: Diagnosis present

## 2018-04-12 DIAGNOSIS — R0902 Hypoxemia: Secondary | ICD-10-CM | POA: Diagnosis not present

## 2018-04-12 DIAGNOSIS — I469 Cardiac arrest, cause unspecified: Secondary | ICD-10-CM | POA: Diagnosis not present

## 2018-04-12 DIAGNOSIS — I481 Persistent atrial fibrillation: Secondary | ICD-10-CM | POA: Diagnosis not present

## 2018-04-12 DIAGNOSIS — J432 Centrilobular emphysema: Secondary | ICD-10-CM | POA: Diagnosis present

## 2018-04-12 DIAGNOSIS — R0602 Shortness of breath: Secondary | ICD-10-CM | POA: Diagnosis present

## 2018-04-12 DIAGNOSIS — I361 Nonrheumatic tricuspid (valve) insufficiency: Secondary | ICD-10-CM | POA: Diagnosis not present

## 2018-04-12 DIAGNOSIS — R1310 Dysphagia, unspecified: Secondary | ICD-10-CM | POA: Diagnosis present

## 2018-04-12 DIAGNOSIS — G9341 Metabolic encephalopathy: Secondary | ICD-10-CM | POA: Diagnosis not present

## 2018-04-12 DIAGNOSIS — D638 Anemia in other chronic diseases classified elsewhere: Secondary | ICD-10-CM | POA: Diagnosis present

## 2018-04-12 DIAGNOSIS — E43 Unspecified severe protein-calorie malnutrition: Secondary | ICD-10-CM

## 2018-04-12 DIAGNOSIS — J9600 Acute respiratory failure, unspecified whether with hypoxia or hypercapnia: Secondary | ICD-10-CM

## 2018-04-12 DIAGNOSIS — Z978 Presence of other specified devices: Secondary | ICD-10-CM

## 2018-04-12 DIAGNOSIS — I509 Heart failure, unspecified: Secondary | ICD-10-CM

## 2018-04-12 DIAGNOSIS — I4901 Ventricular fibrillation: Secondary | ICD-10-CM | POA: Diagnosis not present

## 2018-04-12 LAB — EXPECTORATED SPUTUM ASSESSMENT W GRAM STAIN, RFLX TO RESP C

## 2018-04-12 LAB — CBC WITH DIFFERENTIAL/PLATELET
ABS IMMATURE GRANULOCYTES: 0 10*3/uL (ref 0.0–0.1)
BASOS ABS: 0 10*3/uL (ref 0.0–0.1)
Basophils Relative: 0 %
Eosinophils Absolute: 0 10*3/uL (ref 0.0–0.7)
Eosinophils Relative: 0 %
HCT: 33.4 % — ABNORMAL LOW (ref 39.0–52.0)
HEMOGLOBIN: 10.7 g/dL — AB (ref 13.0–17.0)
Immature Granulocytes: 0 %
LYMPHS PCT: 10 %
Lymphs Abs: 0.6 10*3/uL — ABNORMAL LOW (ref 0.7–4.0)
MCH: 30.6 pg (ref 26.0–34.0)
MCHC: 32 g/dL (ref 30.0–36.0)
MCV: 95.4 fL (ref 78.0–100.0)
Monocytes Absolute: 0.4 10*3/uL (ref 0.1–1.0)
Monocytes Relative: 8 %
Neutro Abs: 4.6 10*3/uL (ref 1.7–7.7)
Neutrophils Relative %: 82 %
Platelets: 159 10*3/uL (ref 150–400)
RBC: 3.5 MIL/uL — AB (ref 4.22–5.81)
RDW: 17.3 % — ABNORMAL HIGH (ref 11.5–15.5)
WBC: 5.7 10*3/uL (ref 4.0–10.5)

## 2018-04-12 LAB — COMPREHENSIVE METABOLIC PANEL
ALBUMIN: 2.7 g/dL — AB (ref 3.5–5.0)
ALK PHOS: 66 U/L (ref 38–126)
ALT: 15 U/L (ref 0–44)
AST: 23 U/L (ref 15–41)
Anion gap: 10 (ref 5–15)
BUN: 8 mg/dL (ref 8–23)
CALCIUM: 10.3 mg/dL (ref 8.9–10.3)
CO2: 31 mmol/L (ref 22–32)
CREATININE: 0.96 mg/dL (ref 0.61–1.24)
Chloride: 97 mmol/L — ABNORMAL LOW (ref 98–111)
GFR calc Af Amer: >60 mL/min (ref >60)
GFR calc non Af Amer: >60 mL/min (ref >60)
GLUCOSE: 113 mg/dL — AB (ref 70–99)
Potassium: 2.5 mmol/L — CL (ref 3.5–5.1)
SODIUM: 138 mmol/L (ref 135–145)
Total Bilirubin: 1.5 mg/dL — ABNORMAL HIGH (ref 0.3–1.2)
Total Protein: 6.6 g/dL (ref 6.5–8.1)

## 2018-04-12 LAB — I-STAT CG4 LACTIC ACID, ED: Lactic Acid, Venous: 2.03 mmol/L (ref 0.5–1.9)

## 2018-04-12 LAB — I-STAT TROPONIN, ED: Troponin i, poc: 0.04 ng/mL (ref 0.00–0.08)

## 2018-04-12 LAB — I-STAT CREATININE, ED: CREATININE: 0.8 mg/dL (ref 0.61–1.24)

## 2018-04-12 LAB — MAGNESIUM: Magnesium: 1.4 mg/dL — ABNORMAL LOW (ref 1.7–2.4)

## 2018-04-12 LAB — EXPECTORATED SPUTUM ASSESSMENT W REFEX TO RESP CULTURE: SPECIAL REQUESTS: NORMAL

## 2018-04-12 MED ORDER — IRBESARTAN 300 MG PO TABS
300.0000 mg | ORAL_TABLET | Freq: Every day | ORAL | Status: DC
  Administered 2018-04-12: 300 mg via ORAL
  Filled 2018-04-12 (×2): qty 1

## 2018-04-12 MED ORDER — ALBUTEROL SULFATE (2.5 MG/3ML) 0.083% IN NEBU
5.0000 mg | INHALATION_SOLUTION | Freq: Once | RESPIRATORY_TRACT | Status: AC
  Administered 2018-04-12: 5 mg via RESPIRATORY_TRACT
  Filled 2018-04-12: qty 6

## 2018-04-12 MED ORDER — SODIUM CHLORIDE 0.9 % IV BOLUS
500.0000 mL | Freq: Once | INTRAVENOUS | Status: AC
  Administered 2018-04-12: 500 mL via INTRAVENOUS

## 2018-04-12 MED ORDER — POTASSIUM CHLORIDE CRYS ER 20 MEQ PO TBCR
40.0000 meq | EXTENDED_RELEASE_TABLET | Freq: Once | ORAL | Status: AC
  Administered 2018-04-12: 40 meq via ORAL
  Filled 2018-04-12: qty 2

## 2018-04-12 MED ORDER — POTASSIUM CHLORIDE 10 MEQ/100ML IV SOLN
10.0000 meq | INTRAVENOUS | Status: DC
  Administered 2018-04-12: 10 meq via INTRAVENOUS

## 2018-04-12 MED ORDER — APIXABAN 5 MG PO TABS
5.0000 mg | ORAL_TABLET | Freq: Two times a day (BID) | ORAL | Status: DC
  Administered 2018-04-12: 5 mg via ORAL
  Filled 2018-04-12 (×2): qty 1

## 2018-04-12 MED ORDER — METOPROLOL TARTRATE 25 MG PO TABS
25.0000 mg | ORAL_TABLET | Freq: Two times a day (BID) | ORAL | Status: DC
  Administered 2018-04-12: 25 mg via ORAL
  Filled 2018-04-12 (×3): qty 1

## 2018-04-12 MED ORDER — SALINE SPRAY 0.65 % NA SOLN
2.0000 | Freq: Every day | NASAL | Status: DC
  Administered 2018-04-12 – 2018-04-18 (×7): 2 via NASAL
  Filled 2018-04-12: qty 44

## 2018-04-12 MED ORDER — SODIUM CHLORIDE 0.9 % IV SOLN
500.0000 mg | INTRAVENOUS | Status: DC
  Administered 2018-04-12 – 2018-04-14 (×3): 500 mg via INTRAVENOUS
  Filled 2018-04-12 (×4): qty 500

## 2018-04-12 MED ORDER — SODIUM CHLORIDE 0.9 % IV SOLN
1.0000 g | INTRAVENOUS | Status: DC
  Administered 2018-04-12 – 2018-04-15 (×4): 1 g via INTRAVENOUS
  Filled 2018-04-12 (×4): qty 10

## 2018-04-12 MED ORDER — TAMSULOSIN HCL 0.4 MG PO CAPS
0.4000 mg | ORAL_CAPSULE | Freq: Every day | ORAL | Status: DC
  Administered 2018-04-13 – 2018-04-17 (×5): 0.4 mg via ORAL
  Filled 2018-04-12 (×5): qty 1

## 2018-04-12 MED ORDER — POTASSIUM CHLORIDE IN NACL 20-0.9 MEQ/L-% IV SOLN
INTRAVENOUS | Status: DC
  Administered 2018-04-12 – 2018-04-13 (×3): via INTRAVENOUS
  Filled 2018-04-12 (×5): qty 1000

## 2018-04-12 MED ORDER — SODIUM CHLORIDE 0.9 % IV SOLN
Freq: Once | INTRAVENOUS | Status: AC
  Administered 2018-04-12: 15:00:00 via INTRAVENOUS

## 2018-04-12 MED ORDER — PIPERACILLIN-TAZOBACTAM 3.375 G IVPB 30 MIN
3.3750 g | Freq: Once | INTRAVENOUS | Status: AC
  Administered 2018-04-12: 3.375 g via INTRAVENOUS
  Filled 2018-04-12: qty 50
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
## 2018-04-12 NOTE — ED Notes (Signed)
RN attempted to give report.

## 2018-04-12 NOTE — ED Triage Notes (Signed)
Pt presents with 2 month h/o shortness of breath that is progressively worsening x 3-4 weeks.  Pt had CT scan ordered by PCP yesterday which showed possible malignancy.  Pt reports productive cough with blood tinged mucus.
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
## 2018-04-13 DIAGNOSIS — R918 Other nonspecific abnormal finding of lung field: Secondary | ICD-10-CM

## 2018-04-13 LAB — CBC WITH DIFFERENTIAL/PLATELET
Abs Immature Granulocytes: 0 10*3/uL (ref 0.0–0.1)
Basophils Absolute: 0 10*3/uL (ref 0.0–0.1)
Basophils Relative: 0 %
EOS ABS: 0.1 10*3/uL (ref 0.0–0.7)
EOS PCT: 1 %
HEMATOCRIT: 30.8 % — AB (ref 39.0–52.0)
Hemoglobin: 10.1 g/dL — ABNORMAL LOW (ref 13.0–17.0)
Immature Granulocytes: 1 %
LYMPHS ABS: 0.8 10*3/uL (ref 0.7–4.0)
Lymphocytes Relative: 12 %
MCH: 31.5 pg (ref 26.0–34.0)
MCHC: 32.8 g/dL (ref 30.0–36.0)
MCV: 96 fL (ref 78.0–100.0)
MONO ABS: 0.6 10*3/uL (ref 0.1–1.0)
Monocytes Relative: 9 %
Neutro Abs: 4.8 10*3/uL (ref 1.7–7.7)
Neutrophils Relative %: 77 %
Platelets: 141 10*3/uL — ABNORMAL LOW (ref 150–400)
RBC: 3.21 MIL/uL — AB (ref 4.22–5.81)
RDW: 17.5 % — AB (ref 11.5–15.5)
WBC: 6.2 10*3/uL (ref 4.0–10.5)

## 2018-04-13 LAB — BASIC METABOLIC PANEL
Anion gap: 6 (ref 5–15)
Anion gap: 9 (ref 5–15)
BUN: 7 mg/dL — AB (ref 8–23)
BUN: 7 mg/dL — ABNORMAL LOW (ref 8–23)
CALCIUM: 9.9 mg/dL (ref 8.9–10.3)
CHLORIDE: 101 mmol/L (ref 98–111)
CO2: 30 mmol/L (ref 22–32)
CO2: 26 mmol/L (ref 22–32)
CREATININE: 0.97 mg/dL (ref 0.61–1.24)
Calcium: 9.8 mg/dL (ref 8.9–10.3)
Chloride: 102 mmol/L (ref 98–111)
Creatinine, Ser: 0.91 mg/dL (ref 0.61–1.24)
GFR calc Af Amer: >60 mL/min (ref >60)
GFR calc non Af Amer: >60 mL/min (ref >60)
GLUCOSE: 108 mg/dL — AB (ref 70–99)
Glucose, Bld: 114 mg/dL — ABNORMAL HIGH (ref 70–99)
Potassium: 2.8 mmol/L — ABNORMAL LOW (ref 3.5–5.1)
Potassium: 3.1 mmol/L — ABNORMAL LOW (ref 3.5–5.1)
SODIUM: 138 mmol/L (ref 135–145)
Sodium: 136 mmol/L (ref 135–145)

## 2018-04-13 LAB — MAGNESIUM: Magnesium: 1.8 mg/dL (ref 1.7–2.4)

## 2018-04-13 MED ORDER — METOPROLOL TARTRATE 25 MG PO TABS
25.0000 mg | ORAL_TABLET | Freq: Three times a day (TID) | ORAL | Status: DC
  Administered 2018-04-13 – 2018-04-16 (×9): 25 mg via ORAL
  Filled 2018-04-13 (×9): qty 1

## 2018-04-13 MED ORDER — MAGNESIUM SULFATE 2 GM/50ML IV SOLN
2.0000 g | Freq: Once | INTRAVENOUS | Status: AC
  Administered 2018-04-13: 2 g via INTRAVENOUS
  Filled 2018-04-13: qty 50

## 2018-04-13 MED ORDER — METOPROLOL TARTRATE 5 MG/5ML IV SOLN: 2.5000 mg | Freq: Four times a day (QID) | INTRAVENOUS | Status: DC | PRN

## 2018-04-13 MED ORDER — ENOXAPARIN SODIUM 40 MG/0.4ML ~~LOC~~ SOLN
40.0000 mg | SUBCUTANEOUS | Status: DC
  Administered 2018-04-13 – 2018-04-17 (×4): 40 mg via SUBCUTANEOUS
  Filled 2018-04-13 (×4): qty 0.4

## 2018-04-13 MED ORDER — POTASSIUM CHLORIDE CRYS ER 20 MEQ PO TBCR
40.0000 meq | EXTENDED_RELEASE_TABLET | ORAL | Status: AC
  Administered 2018-04-13 (×2): 40 meq via ORAL
  Filled 2018-04-13: qty 2

## 2018-04-13 NOTE — Care Management Note (Addendum)
Case Management Note  Patient Details  Name: Derrick Ayala MRN: [id number] Date of Birth: [birth year]  Subjective/Objective:     From home alone, while wife is in SNF getting rehab.  Presents with sob, hx of glottis ca, new onset of afib, on eliquis pta, ?CAP, Pulmonary to see, per pt eval rec HHPT and rolling walker.  NCM spoke with patient and daughter at bedside, offered choice for HHPT , daughter wanted to look over the agency list , left list with her, NCM will check back.      9/18 Derrick Bamberger RN, BSN-  Per speech patient is aspirating, NPO, palliative consulted, has had delirium, in restraints, cards consulted.  Per pt eval now rec SNF.              Action/Plan: NCM will follow for transition of care needs.   Expected Discharge Date:                  Expected Discharge Plan:  [place]  In-House Referral:     Discharge planning Services  CM Consult  Post Acute Care Choice:    Choice offered to:     DME Arranged:    DME Agency:     HH Arranged:    [facility] Agency:     Status of Service:  In process, will continue to follow  If discussed at [facility] Length of Stay Meetings, dates discussed:    Additional Comments:  Derrick Mayo, RN 04/13/2018, 12:53 PM

## 2018-04-13 NOTE — Consult Note (Signed)
PULMONARY / CRITICAL CARE MEDICINE   NAME:  Derrick Ayala, MRN:  [id number], DOB:  1934-10-30, LOS: 1 ADMISSION DATE:  04/01/2018, CONSULTATION DATE:  04/13/2018 REFERRING MD:  Dr. Posey Pronto, [facility] COMPLAINT:  Cough  BRIEF HISTORY:    83 yo male former smoker presented with progressive cough with sputum.  Found to have mass like consolidation on CT chest.  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  HISTORY OF PRESENT ILLNESS   83 yo male has cough with sputum for past 6 months.  This has been getting worse.  Was bringing up green sputum with some blood streaking.  Not having fever, or chest pain.  Was seen by PCP.  Had CXR and then CT chest, and advised he needed treatment for pneumonia.  Started on ABx.  SIGNIFICANT PAST MEDICAL HISTORY   HTN, A fib, SCC Lt vocal cord s/p XRT  SIGNIFICANT EVENTS:  9/12 Admit  STUDIES:   CT chest 9/12 >> 5.4 cm subcarinal mass, b/l pleural calcifications, small b/l effusions, occluded LLL bronchus, 6.8 cm mass Lt infrahilar region, moderate centrilobular emphysema (reviewed by me)  CULTURES:  Blood 9/12 >> Sputum 9/12 >>   ANTIBIOTICS:  Rocephin 9/12 >> Zithromax 9/12 >>  LINES/TUBES:    CONSULTANTS:    SUBJECTIVE:    CONSTITUTIONAL: BP (!) 127/110 (BP Location: Right Arm)   Pulse 88   Temp 97.9 F (36.6 C) (Axillary)   Resp 16   Ht 5\' 11"  (1.803 m)   Wt 63.5 kg   SpO2 100%   BMI 19.53 kg/m   I/O last 3 completed shifts: In: 1655.2 [I.V.:1014.6; IV Piggyback:640.6] Out: -         PHYSICAL EXAM:  General - alert Eyes - pupils reactive ENT - no sinus tenderness, no stridor, poor dentition Cardiac - regular rate/rhythm, no murmur Chest - decreased BS Lt base, no wheeze Abdomen - soft, non tender, + bowel sounds, no hepatosplenomegaly Extremities - 2+ edema Skin - no rashes Lymphatics - no lymphadenopathy Neuro - CN intact, normal strength, moves extremities, follows commands Psych - normal mood and  behavior   ASSESSMENT AND PLAN    Lt lower lung mass and subcarinal mass with post obstructive pneumonia. - main concern is that he has malignancy - continue IV ABx through the weekend - will f/u CXR on 9/16 and then determine when he should proceed with bronchoscopy  SUMMARY OF TODAY'S PLAN:  Tx with ABx, f/u next week to decide about bronchoscopy  Best Practice / Goals of Care / Disposition.   DVT PROPHYLAXIS: SCDs SUP: Not indicated NUTRITION: soft diet MOBILITY: bed rest GOALS OF CARE: full code FAMILY DISCUSSIONS: no family at bedside  D/w Dr. Posey Pronto  LABS  Glucose No results for input(s): GLUCAP in the last 168 hours.  BMET Recent Labs  Lab 04/22/2018 1137 04/29/2018 1146 04/13/18 0300  NA 138  --  138  K 2.5*  --  2.8*  CL 97*  --  102  CO2 31  --  30  BUN 8  --  7*  CREATININE 0.96 0.80 0.97  GLUCOSE 113*  --  114*    Liver Enzymes Recent Labs  Lab 04/14/2018 1137  AST 23  ALT 15  ALKPHOS 66  BILITOT 1.5*  ALBUMIN 2.7*    Electrolytes Recent Labs  Lab 04/07/2018 1137 04/27/2018 1416 04/13/18 0300  CALCIUM 10.3  --  9.8  MG  --  1.4*  --  CBC Recent Labs  Lab 04/03/2018 1137 04/13/18 0300  WBC 5.7 6.2  HGB 10.7* 10.1*  HCT 33.4* 30.8*  PLT 159 141*    ABG No results for input(s): PHART, PCO2ART, PO2ART in the last 168 hours.  Coag's No results for input(s): APTT, INR in the last 168 hours.  Sepsis Markers Recent Labs  Lab 04/07/2018 1441  LATICACIDVEN 2.03*    Cardiac Enzymes No results for input(s): TROPONINI, PROBNP in the last 168 hours.  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  PAST SURGICAL HISTORY:  He  has a past surgical history that includes Hernia repair; Colonoscopy with propofol (N/A, 10/19/2017);  and Esophagogastroduodenoscopy (egd) with propofol (N/A, 10/19/2017).  No Known Allergies  No current facility-administered medications on file prior to encounter.    Current Outpatient Medications on File Prior to Encounter  Medication Sig  . betamethasone dipropionate (DIPROLENE) 0.05 % ointment Apply 1 application topically 2 (two) times daily.  . Diphenhydramine-PE-APAP (NIGHTTIME COLD & FLU MAX STR PO) Take 1 tablet by mouth at bedtime as needed (for congestion).  Marland Kitchen ELIQUIS 5 MG TABS tablet Take 5 mg by mouth 2 (two) times daily.  . furosemide (LASIX) 20 MG tablet Take 20 mg by mouth daily.  . irbesartan (AVAPRO) 300 MG tablet Take 300 mg by mouth daily.  . metoprolol tartrate (LOPRESSOR) 25 MG tablet Take 25 mg by mouth 2 (two) times daily.  . pseudoephedrine (SUDAFED) 120 MG 12 hr tablet Take 120 mg by mouth daily as needed for congestion.  . sildenafil (VIAGRA) 100 MG tablet Take 100 mg by mouth as needed for erectile dysfunction.  . sodium chloride (OCEAN) 0.65 % SOLN nasal spray Place 2 sprays into both nostrils daily.  . tamsulosin (FLOMAX) 0.4 MG CAPS capsule Take 0.4 mg by mouth daily after breakfast.  . triamcinolone cream (KENALOG) 0.1 % Apply 1 application topically 2 (two) times daily as needed (on affected area).     FAMILY HISTORY:   His family history includes Cancer in his brother and mother.  SOCIAL HISTORY:  He  reports that he quit smoking about 18 years ago. He has a 10.00 pack-year smoking history. He quit smokeless tobacco use about 4 years ago.  His smokeless tobacco use included chew. He reports that he drank alcohol. He reports that he does not use drugs.  REVIEW OF SYSTEMS:    Reviewed and negative except above  Chesley Mires, MD [facility] Pulmonary/Critical Care 04/13/2018, 11:51 AM

## 2018-04-13 NOTE — Progress Notes (Signed)
[facility] Progress Note  Patient: Derrick Ayala VHQ:[id number]   PCP: Lujean Amel, MD DOB: Nov 22, 1934   DOA: 04/03/2018   DOS: 04/13/2018   Date of Service: the patient was seen and examined on 04/13/2018  Subjective: Patient presented with complaints of cough and shortness of breath.  Still has some cough.  Breathing is better.  No chest pain abdominal pain.  Reports significant weight loss.  Also has constipation.  Brief hospital course: Pt. 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 fib, HTN; admitted on 04/25/2018, presented with complaint of shortness of breath, was found to have postobstructive pneumonia with a possible pulmonary mass. Currently further plan is further work-up for the pulmonary mass and treatment of the pneumonia.  Assessment and Plan: 1.  Community-acquired pneumonia. Possible postobstructive etiology. Presented with shortness of breath, CT scan and chest x-ray are showing left lower lobe pneumonia versus a possible mass. No significant leukocytosis or fever but patient with copious amount of expectoration. For now continue with IV ceftriaxone and azithromycin. Follow-up on cultures.  2.  Pulmonary mass. Left lower lobe pneumonia is appearing more likely a possible necrotic lung mass. Patient also has subcarinal lung mass as well. Pulmonary consulted. Recommend to continue IV antibiotics through the weekend and follow-up chest x-ray on 916 at which time they will decide whether patient should get a bronchoscopy inpatient versus outpatient. Currently holding Eliquis in order to get the patient ready for possible procedure.  3.  A. fib. Mild RVR. CHA?DS?-VASc 3 Patient on chronic anticoagulation with Eliquis. Currently on hold. Lovenox for DVT prophylaxis. Patient on Lopressor at home 25 mg twice daily, we will increase to 25 mg 3 times daily for now. Also use PRN Lopressor.  4.  Hypokalemia. Hypomagnesemia Potassium  significantly lower. Replacing IV as well as orally. Recheck potassium at noon. Magnesium supplemented as well.  5.  Essential hypertension. Patient is on Avapro as well as Lopressor. Currently holding Avapro continue Lopressor.  6.  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  7.  BPH. Continue Flomax.  8.  Protein calorie malnutrition. Moderate. In the setting of possible malignancy. Nutrition supplementation.  Diet: Cardiac diet DVT Prophylaxis: subcutaneous Heparin  Advance goals of care discussion: Full code  Family Communication: no family was present at bedside, at the time of interview.   Disposition:  Discharge to be determined .  Consultants: PCCM   Procedures: none  Antibiotics: Anti-infectives (From admission, onward)   Start     Dose/Rate Route Frequency Ordered Stop   04/10/2018 2000  cefTRIAXone (ROCEPHIN) 1 g in sodium chloride 0.9 % 100 mL IVPB     1 g 200 mL/hr over 30 Minutes Intravenous Every 24 hours 04/05/2018 1550 05/02/2018 1959   04/09/2018 1800  azithromycin (ZITHROMAX) 500 mg in sodium chloride 0.9 % 250 mL IVPB     500 mg 250 mL/hr over 60 Minutes Intravenous Every 24 hours 04/10/2018 1550 05-02-18 1759   04/05/2018 1330  piperacillin-tazobactam (ZOSYN) IVPB 3.375 g     3.375 g 100 mL/hr over 30 Minutes Intravenous  Once 04/15/2018 1324  1555       Objective: Physical Exam: Vitals:   04/04/2018 1545 04/08/2018 1619 04/02/2018 2319 04/13/18 0800  BP: (!) 138/95 (!) 140/101 (!) 120/92 (!) 127/110  Pulse: 88 76 65 88  Resp: (!) 25 17 16    Temp:  (!) 97.3 F (36.3 C) 98.1 F (36.7 C) 97.9 F (36.6  C)  TempSrc:  Oral Oral Axillary  SpO2: 96% 100% 100% 100%  Weight:      Height:        Intake/Output Summary (Last 24 hours) at 04/13/2018 1330 Last data filed at 04/13/2018 0700 Gross per 24 hour  Intake 1655.19 ml  Output -  Net 1655.19 ml   Filed Weights   04/10/2018 1121  Weight: 63.5 kg    General: Alert, Awake and Oriented to Time, Place and Person. Appear in mild distress, affect appropriate Eyes: PERRL, Conjunctiva normal ENT: Oral Mucosa clear moist. Neck: no JVD, no Abnormal Mass Or lumps Cardiovascular: S1 and S2 Present, no Murmur, Peripheral Pulses Present Respiratory: increased respiratory effort, Bilateral Air entry equal and Decreased, no use of accessory muscle, bilateral basal Crackles, left Occasional wheezes Abdomen: Bowel Sound present, Soft and no tenderness, no hernia Skin: no redness, no Rash, no induration Extremities: no Pedal edema, no calf tenderness Neurologic: Grossly no focal neuro deficit. Bilaterally Equal motor strength  Data Reviewed: CBC: Recent Labs  Lab 04/14/2018 1137 04/13/18 0300  WBC 5.7 6.2  NEUTROABS 4.6 4.8  HGB 10.7* 10.1*  HCT 33.4* 30.8*  MCV 95.4 96.0  PLT 159 957*   Basic Metabolic Panel: Recent Labs  Lab 04/05/2018 1137 04/18/2018 1146 04/13/2018 1416 04/13/18 0300  NA 138  --   --  138  K 2.5*  --   --  2.8*  CL 97*  --   --  102  CO2 31  --   --  30  GLUCOSE 113*  --   --  114*  BUN 8  --   --  7*  CREATININE 0.96 0.80  --  0.97  CALCIUM 10.3  --   --  9.8  MG  --   --  1.4*  --     Liver Function Tests: Recent Labs  Lab 04/27/2018 1137  AST 23  ALT 15  ALKPHOS 66  BILITOT 1.5*  PROT 6.6  ALBUMIN 2.7*   No results for input(s): LIPASE, AMYLASE in the last 168 hours. No results for input(s): AMMONIA in the last 168 hours. Coagulation Profile: No results for input(s): INR, PROTIME in the last 168 hours. Cardiac Enzymes: No results for input(s): CKTOTAL, CKMB, CKMBINDEX, TROPONINI in the last 168 hours. BNP (last 3 results) No results for input(s): PROBNP in the last 8760 hours. CBG: No results for input(s): GLUCAP in the last 168 hours. Studies: No results found.  Scheduled Meds: . sodium chloride  2 spray Each Nare Daily  . tamsulosin  0.4 mg Oral QPC breakfast   Continuous Infusions: . 0.9 %  NaCl with KCl 20 mEq / L 75 mL/hr at 04/13/18 0608  . azithromycin 500 mg (04/13/2018 1652)  . cefTRIAXone (ROCEPHIN)  IV 1 g (04/06/2018 2203)   PRN Meds:   Time spent: 35 minutes  Author: Berle Mull, MD [facility] Pager: [phone] 04/13/2018 1:30 PM  If 7PM-7AM, please contact night-coverage at [email or web address], password [facility]

## 2018-04-13 NOTE — Evaluation (Signed)
Physical Therapy Evaluation Patient Details Name: Derrick Ayala MRN: [id number] DOB: Jul 22, 1935 Today's Date: 04/13/2018   History of Present Illness  83 year old male here with generalized shortness of breath and cough.  CT scan from yesterday reviewed and is concerning for consolidative pneumonia with possible obstructive mass.   Clinical Impression  PTA pt reports living alone while his wife in rehab. He is independent with ambulation of limited community distances without AD, and independent in ADLs, iADLs. Pt's daughter lives nearby. Pt currently limited in safe mobility by oxygen desaturation with ambulation (see General Comments), 3/4 DoE and generalized weakness in B LE. Pt currently is min guard for bed mobility, transfers and ambulation with RW. Pt is unfamiliar with RW use and requires education about proximity to walker for ambulation and keeping RW with him as he does ADLs. PT recommends HHPT level rehab at d/c to improve safety awareness and increase strength. PT will follow acutely.     Follow Up Recommendations Home health PT    Equipment Recommendations  Rolling walker with 5" wheels    Recommendations for Other Services       Precautions / Restrictions Precautions Precautions: Fall Restrictions Weight Bearing Restrictions: No      Mobility  Bed Mobility Overal bed mobility: Needs Assistance Bed Mobility: Supine to Sit     Supine to sit: HOB elevated;Min guard     General bed mobility comments: min guard for safety  Transfers Overall transfer level: Needs assistance Equipment used: Rolling walker (2 wheeled) Transfers: Sit to/from Stand Sit to Stand: Min guard         General transfer comment: min guard for safety, vc for hand placement  Ambulation/Gait Ambulation/Gait assistance: Min guard Gait Distance (Feet): 200 Feet Assistive device: Rolling walker (2 wheeled) Gait Pattern/deviations: Step-through pattern;Trunk flexed Gait velocity: slowed Gait  velocity interpretation: 1.31 - 2.62 ft/sec, indicative of limited community ambulator General Gait Details: min guard for safety, steady gait, vc for proximity to RW and not leaving it for tasks like washing hands        Balance Overall balance assessment: Needs assistance Sitting-balance support: Feet supported;No upper extremity supported Sitting balance-Leahy Scale: Good     Standing balance support: No upper extremity supported;During functional activity Standing balance-Leahy Scale: Fair                               Pertinent Vitals/Pain Pain Assessment: No/denies pain    Home Living Family/patient expects to be discharged to:: Private residence Living Arrangements: Spouse/significant other(however she in in rehab) Available Help at Discharge: Family;Available PRN/intermittently Type of Home: House Home Access: Stairs to enter Entrance Stairs-Rails: None Entrance Stairs-Number of Steps: 2 Home Layout: One level Home Equipment: Grab bars - tub/shower      Prior Function Level of Independence: Independent         Comments: reports driving, and visiting wife in rehab without AD,         Extremity/Trunk Assessment   Upper Extremity Assessment Upper Extremity Assessment: Overall WFL for tasks assessed    Lower Extremity Assessment Lower Extremity Assessment: Generalized weakness       Communication   Communication: HOH  Cognition Arousal/Alertness: Awake/alert Behavior During Therapy: WFL for tasks assessed/performed Overall Cognitive Status: Within Functional Limits for tasks assessed  General Comments General comments (skin integrity, edema, etc.): Pt with 3/4 DoE  and SaO2 of 84%O2 with ambulation of 100 feet, able to recover SaO2 to 90%O2 with pursed lipped breathing, edema in bilateral LE around ankles, VSS        Assessment/Plan    PT Assessment Patient needs continued PT  services  PT Problem List Decreased knowledge of use of DME;Cardiopulmonary status limiting activity;Decreased strength       PT Treatment Interventions DME instruction;Gait training;Stair training;Functional mobility training;Therapeutic activities;Therapeutic exercise;Balance training;Cognitive remediation;Patient/family education    PT Goals (Current goals can be found in the Care Plan section)  Acute Rehab PT Goals Patient Stated Goal: feel stronger PT Goal Formulation: With patient Time For Goal Achievement: 04/27/18 Potential to Achieve Goals: Good    Frequency Min 3X/week    AM-PAC PT "6 Clicks" Daily Activity  Outcome Measure Difficulty turning over in bed (including adjusting bedclothes, sheets and blankets)?: A Little Difficulty moving from lying on back to sitting on the side of the bed? : Unable Difficulty sitting down on and standing up from a chair with arms (e.g., wheelchair, bedside commode, etc,.)?: Unable Help needed moving to and from a bed to chair (including a wheelchair)?: A Little Help needed walking in hospital room?: A Little Help needed climbing 3-5 steps with a railing? : A Lot 6 Click Score: 13    End of Session Equipment Utilized During Treatment: Gait belt Activity Tolerance: Patient tolerated treatment well Patient left: in chair;with call bell/phone within reach;with chair alarm set Nurse Communication: Mobility status PT Visit Diagnosis: Other abnormalities of gait and mobility (R26.89);Muscle weakness (generalized) (M62.81);History of falling (Z91.81);Difficulty in walking, not elsewhere classified (R26.2)    Time: 7673-4193 PT Time Calculation (min) (ACUTE ONLY): 32 min   Charges:   PT Evaluation $PT Eval Moderate Complexity: 1 Mod PT Treatments $Gait Training: 8-22 mins        Kariana Wiles B. Migdalia Dk PT, DPT Acute Rehabilitation Services Pager [phone] Office [phone]   [place] 04/13/2018, 12:19 PM

## 2018-04-14 LAB — CBC WITH DIFFERENTIAL/PLATELET
ABS IMMATURE GRANULOCYTES: 0 10*3/uL (ref 0.0–0.1)
BASOS ABS: 0 10*3/uL (ref 0.0–0.1)
Basophils Relative: 0 %
Eosinophils Absolute: 0 10*3/uL (ref 0.0–0.7)
Eosinophils Relative: 1 %
HEMATOCRIT: 32.4 % — AB (ref 39.0–52.0)
HEMOGLOBIN: 10.5 g/dL — AB (ref 13.0–17.0)
Immature Granulocytes: 0 %
LYMPHS ABS: 0.8 10*3/uL (ref 0.7–4.0)
LYMPHS PCT: 13 %
MCH: 31.5 pg (ref 26.0–34.0)
MCHC: 32.4 g/dL (ref 30.0–36.0)
MCV: 97.3 fL (ref 78.0–100.0)
Monocytes Absolute: 0.6 10*3/uL (ref 0.1–1.0)
Monocytes Relative: 10 %
NEUTROS ABS: 4.4 10*3/uL (ref 1.7–7.7)
Neutrophils Relative %: 76 %
Platelets: 134 10*3/uL — ABNORMAL LOW (ref 150–400)
RBC: 3.33 MIL/uL — AB (ref 4.22–5.81)
RDW: 17.5 % — ABNORMAL HIGH (ref 11.5–15.5)
WBC: 5.8 10*3/uL (ref 4.0–10.5)

## 2018-04-14 LAB — COMPREHENSIVE METABOLIC PANEL
ALBUMIN: 2.7 g/dL — AB (ref 3.5–5.0)
ALK PHOS: 71 U/L (ref 38–126)
ALT: 14 U/L (ref 0–44)
ANION GAP: 7 (ref 5–15)
AST: 18 U/L (ref 15–41)
BILIRUBIN TOTAL: 1.7 mg/dL — AB (ref 0.3–1.2)
BUN: 8 mg/dL (ref 8–23)
CHLORIDE: 103 mmol/L (ref 98–111)
CO2: 29 mmol/L (ref 22–32)
Calcium: 10 mg/dL (ref 8.9–10.3)
Creatinine, Ser: 0.96 mg/dL (ref 0.61–1.24)
GFR calc non Af Amer: >60 mL/min (ref >60)
Glucose, Bld: 109 mg/dL — ABNORMAL HIGH (ref 70–99)
POTASSIUM: 4.1 mmol/L (ref 3.5–5.1)
Sodium: 139 mmol/L (ref 135–145)
Total Protein: 6.6 g/dL (ref 6.5–8.1)

## 2018-04-14 LAB — MAGNESIUM: Magnesium: 1.8 mg/dL (ref 1.7–2.4)

## 2018-04-14 LAB — CULTURE, RESPIRATORY
CULTURE: NORMAL
SPECIAL REQUESTS: NORMAL

## 2018-04-14 LAB — CULTURE, RESPIRATORY W GRAM STAIN

## 2018-04-14 MED ORDER — SACCHAROMYCES BOULARDII 250 MG PO CAPS
250.0000 mg | ORAL_CAPSULE | Freq: Two times a day (BID) | ORAL | Status: DC
  Administered 2018-04-14 – 2018-04-16 (×5): 250 mg via ORAL
  Filled 2018-04-14 (×5): qty 1

## 2018-04-14 MED ORDER — GUAIFENESIN ER 600 MG PO TB12
600.0000 mg | ORAL_TABLET | Freq: Two times a day (BID) | ORAL | Status: DC
  Administered 2018-04-14 – 2018-04-17 (×6): 600 mg via ORAL
  Filled 2018-04-14 (×6): qty 1

## 2018-04-14 MED ORDER — ALBUTEROL SULFATE (2.5 MG/3ML) 0.083% IN NEBU
2.5000 mg | INHALATION_SOLUTION | Freq: Four times a day (QID) | RESPIRATORY_TRACT | Status: DC
  Administered 2018-04-14 – 2018-04-15 (×7): 2.5 mg via RESPIRATORY_TRACT
  Filled 2018-04-14 (×7): qty 3

## 2018-04-14 MED ORDER — BENZONATATE 100 MG PO CAPS
100.0000 mg | ORAL_CAPSULE | Freq: Three times a day (TID) | ORAL | Status: DC
  Administered 2018-04-14 – 2018-04-17 (×8): 100 mg via ORAL
  Filled 2018-04-14 (×8): qty 1

## 2018-04-14 NOTE — Progress Notes (Signed)
[facility] Progress Note  Patient: Derrick Ayala ELF:[id number]   PCP: Lujean Amel, MD DOB: 11/10/1934   DOA: 04/13/2018   DOS: 04/14/2018   Date of Service: the patient was seen and examined on 04/14/2018  Subjective: Continues to have cough and shortness of breath.  No fever.  No chest pain no abdominal pain.  Nurse reported multiple bowel movements as well.  Solid without any blood.  Brief hospital course: Pt. 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 fib, HTN; admitted on 04/18/2018, presented with complaint of shortness of breath, was found to have postobstructive pneumonia with a possible pulmonary mass. Currently further plan is further work-up for the pulmonary mass and treatment of the pneumonia.  Assessment and Plan: 1.  Community-acquired pneumonia. Possible postobstructive etiology. Presented with shortness of breath, CT scan and chest x-ray are showing left lower lobe pneumonia versus a possible mass. No significant leukocytosis or fever but patient with copious amount of expectoration. For now continue with IV ceftriaxone and azithromycin. Follow-up on cultures.  Pulmonary toilet, flutter valve, Mucinex, Tessalon Perles.  2.  Pulmonary mass. Left lower lobe pneumonia is appearing more likely a possible necrotic lung mass. Patient also has subcarinal lung mass as well. Pulmonary consulted. Recommend to continue IV antibiotics through the weekend and follow-up chest x-ray on 916 at which time they will decide whether patient should get a bronchoscopy inpatient versus outpatient. Currently holding Eliquis in order to get the patient ready for possible procedure.  3.  A. fib. Mild RVR. CHA?DS?-VASc 3 Patient on chronic anticoagulation with Eliquis. Currently on hold. Lovenox for DVT prophylaxis. Patient on Lopressor at home 25 mg twice daily, we will increase to 25 mg 3 times daily for now. Also use PRN Lopressor.  4.   Hypokalemia. Hypomagnesemia Replaced.  5.  Essential hypertension. Patient is on Avapro as well as Lopressor. Currently holding Avapro continue Lopressor.  6.  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  7.  BPH. Continue Flomax.  8.  Protein calorie malnutrition. Moderate. In the setting of possible malignancy. Nutrition supplementation.  Diet: Cardiac diet DVT Prophylaxis: subcutaneous Heparin  Advance goals of care discussion: Full code  Family Communication: no family was present at bedside, at the time of interview.   Disposition:  Discharge to be determined .  Consultants: PCCM   Procedures: none  Antibiotics: Anti-infectives (From admission, onward)   Start     Dose/Rate Route Frequency Ordered Stop   04/11/2018 2000  cefTRIAXone (ROCEPHIN) 1 g in sodium chloride 0.9 % 100 mL IVPB     1 g 200 mL/hr over 30 Minutes Intravenous Every 24 hours 04/16/2018 1550 05/04/18 1959   04/08/2018 1800  azithromycin (ZITHROMAX) 500 mg in sodium chloride 0.9 % 250 mL IVPB     500 mg 250 mL/hr over 60 Minutes Intravenous Every 24 hours 04/29/2018 1550 05-04-18 1759   04/05/2018 1330  piperacillin-tazobactam (ZOSYN) IVPB 3.375 g     3.375 g 100 mL/hr over 30 Minutes Intravenous  Once 04/04/2018 1324 04/28/2018 1555       Objective: Physical Exam: Vitals:   04/14/18 0818 04/14/18 1112 04/14/18 1456 04/14/18 1639  BP: (!) 123/99   (!) 147/108  Pulse: 60   78  Resp: (!) 24   18  Temp:    (!) 97.5 F (36.4 C)  TempSrc:    Oral  SpO2: 97% 97% 97% 100%  Weight:  Height:        Intake/Output Summary (Last 24 hours) at 04/14/2018 1719 Last data filed at 04/14/2018 0551 Gross per 24 hour  Intake 1037.86 ml  Output 300 ml  Net 737.86 ml   Filed Weights   04/05/2018 1121  Weight: 63.5 kg   General: Alert, Awake and Oriented to Time, Place and Person. Appear in mild distress, affect appropriate Eyes: PERRL, Conjunctiva normal ENT: Oral  Mucosa clear moist. Neck: no JVD, no Abnormal Mass Or lumps Cardiovascular: S1 and S2 Present, no Murmur, Peripheral Pulses Present Respiratory: increased respiratory effort, Bilateral Air entry equal and Decreased, no use of accessory muscle, bilateral basal Crackles, left Occasional wheezes Abdomen: Bowel Sound present, Soft and no tenderness, no hernia Skin: no redness, no Rash, no induration Extremities: no Pedal edema, no calf tenderness Neurologic: Grossly no focal neuro deficit. Bilaterally Equal motor strength  Data Reviewed: CBC: Recent Labs  Lab 04/29/2018 1137 04/13/18 0300 04/14/18 0405  WBC 5.7 6.2 5.8  NEUTROABS 4.6 4.8 4.4  HGB 10.7* 10.1* 10.5*  HCT 33.4* 30.8* 32.4*  MCV 95.4 96.0 97.3  PLT 159 141* 005*   Basic Metabolic Panel: Recent Labs  Lab 04/14/2018 1137 04/28/2018 1146 04/21/2018 1416 04/13/18 0300 04/13/18 1304 04/14/18 0405  NA 138  --   --  138 136 139  K 2.5*  --   --  2.8* 3.1* 4.1  CL 97*  --   --  102 101 103  CO2 31  --   --  30 26 29   GLUCOSE 113*  --   --  114* 108* 109*  BUN 8  --   --  7* 7* 8  CREATININE 0.96 0.80  --  0.97 0.91 0.96  CALCIUM 10.3  --   --  9.8 9.9 10.0  MG  --   --  1.4*  --  1.8 1.8    Liver Function Tests: Recent Labs  Lab 04/07/2018 1137 04/14/18 0405  AST 23 18  ALT 15 14  ALKPHOS 66 71  BILITOT 1.5* 1.7*  PROT 6.6 6.6  ALBUMIN 2.7* 2.7*   No results for input(s): LIPASE, AMYLASE in the last 168 hours. No results for input(s): AMMONIA in the last 168 hours. Coagulation Profile: No results for input(s): INR, PROTIME in the last 168 hours. Cardiac Enzymes: No results for input(s): CKTOTAL, CKMB, CKMBINDEX, TROPONINI in the last 168 hours. BNP (last 3 results) No results for input(s): PROBNP in the last 8760 hours. CBG: No results for input(s): GLUCAP in the last 168 hours. Studies: No results found.  Scheduled Meds: . albuterol  2.5 mg Nebulization QID  . benzonatate  100 mg Oral TID  . enoxaparin  (LOVENOX) injection  40 mg Subcutaneous Q24H  . guaiFENesin  600 mg Oral BID  . metoprolol tartrate  25 mg Oral TID  . saccharomyces boulardii  250 mg Oral BID  . sodium chloride  2 spray Each Nare Daily  . tamsulosin  0.4 mg Oral QPC breakfast   Continuous Infusions: . azithromycin 500 mg (04/13/18 1656)  . cefTRIAXone (ROCEPHIN)  IV 1 g (04/13/18 2041)   PRN Meds:   Time spent: 35 minutes  Author: Berle Mull, MD [facility] Pager: [phone] 04/14/2018 5:19 PM  If 7PM-7AM, please contact night-coverage at [email or web address], password [facility]

## 2018-04-15 MED ORDER — FUROSEMIDE 10 MG/ML IJ SOLN
20.0000 mg | Freq: Once | INTRAMUSCULAR | Status: AC
  Administered 2018-04-15: 20 mg via INTRAVENOUS
  Filled 2018-04-15: qty 2

## 2018-04-15 MED ORDER — AZITHROMYCIN 250 MG PO TABS
500.0000 mg | ORAL_TABLET | Freq: Every day | ORAL | Status: DC
  Administered 2018-04-15: 500 mg via ORAL
  Filled 2018-04-15: qty 2

## 2018-04-15 NOTE — Progress Notes (Signed)
PHARMACIST - PHYSICIAN COMMUNICATION DR: Posey Pronto CONCERNING: Antibiotic IV to Oral Route Change Policy  RECOMMENDATION: This patient is receiving Azithromycin by the intravenous route.  Based on criteria approved by the Pharmacy and Therapeutics Committee, the antibiotic(s) is/are being converted to the equivalent oral dose form(s).   DESCRIPTION: These criteria include:  Patient being treated for a respiratory tract infection, urinary tract infection, cellulitis or clostridium difficile associated diarrhea if on metronidazole  The patient is not neutropenic and does not exhibit a GI malabsorption state  The patient is eating (either orally or via tube) and/or has been taking other orally administered medications for a least 24 hours  The patient is improving clinically and has a Tmax < 100.5  If you have questions about this conversion, please contact the [facility], PharmD PGY1 Pharmacy Resident Phone [phone] 04/15/2018     1:22 PM
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
## 2018-04-16 ENCOUNTER — Inpatient Hospital Stay (HOSPITAL_COMMUNITY): Payer: Medicare Other

## 2018-04-16 DIAGNOSIS — I4891 Unspecified atrial fibrillation: Secondary | ICD-10-CM

## 2018-04-16 DIAGNOSIS — I5021 Acute systolic (congestive) heart failure: Secondary | ICD-10-CM

## 2018-04-16 DIAGNOSIS — I361 Nonrheumatic tricuspid (valve) insufficiency: Secondary | ICD-10-CM

## 2018-04-16 DIAGNOSIS — R0902 Hypoxemia: Secondary | ICD-10-CM

## 2018-04-16 DIAGNOSIS — R0602 Shortness of breath: Secondary | ICD-10-CM

## 2018-04-16 DIAGNOSIS — I340 Nonrheumatic mitral (valve) insufficiency: Secondary | ICD-10-CM

## 2018-04-16 DIAGNOSIS — I481 Persistent atrial fibrillation: Secondary | ICD-10-CM

## 2018-04-16 LAB — BLOOD GAS, ARTERIAL
Acid-base deficit: 0.5 mmol/L (ref 0.0–2.0)
Bicarbonate: 23.3 mmol/L (ref 20.0–28.0)
Drawn by: 347621
O2 CONTENT: 3.5 L/min
O2 Saturation: 98.8 %
PCO2 ART: 36 mmHg (ref 32.0–48.0)
PH ART: 7.427 (ref 7.350–7.450)
PO2 ART: 129 mmHg — AB (ref 83.0–108.0)
Patient temperature: 98.6

## 2018-04-16 LAB — COMPREHENSIVE METABOLIC PANEL
ALK PHOS: 71 U/L (ref 38–126)
ALT: 18 U/L (ref 0–44)
ANION GAP: 15 (ref 5–15)
AST: 28 U/L (ref 15–41)
Albumin: 2.8 g/dL — ABNORMAL LOW (ref 3.5–5.0)
BILIRUBIN TOTAL: 1.5 mg/dL — AB (ref 0.3–1.2)
BUN: 13 mg/dL (ref 8–23)
CALCIUM: 10.8 mg/dL — AB (ref 8.9–10.3)
CO2: 22 mmol/L (ref 22–32)
CREATININE: 1.1 mg/dL (ref 0.61–1.24)
Chloride: 102 mmol/L (ref 98–111)
GFR calc non Af Amer: >60 mL/min (ref >60)
GLUCOSE: 107 mg/dL — AB (ref 70–99)
Potassium: 3.6 mmol/L (ref 3.5–5.1)
SODIUM: 139 mmol/L (ref 135–145)
TOTAL PROTEIN: 7.2 g/dL (ref 6.5–8.1)

## 2018-04-16 LAB — CBC WITH DIFFERENTIAL/PLATELET
BASOS PCT: 0 %
Basophils Absolute: 0 10*3/uL (ref 0.0–0.1)
EOS PCT: 0 %
Eosinophils Absolute: 0 10*3/uL (ref 0.0–0.7)
HCT: 35.1 % — ABNORMAL LOW (ref 39.0–52.0)
HEMOGLOBIN: 11.3 g/dL — AB (ref 13.0–17.0)
LYMPHS PCT: 10 %
Lymphs Abs: 0.7 10*3/uL (ref 0.7–4.0)
MCH: 31.5 pg (ref 26.0–34.0)
MCHC: 32.2 g/dL (ref 30.0–36.0)
MCV: 97.8 fL (ref 78.0–100.0)
Monocytes Absolute: 0.5 10*3/uL (ref 0.1–1.0)
Monocytes Relative: 7 %
NEUTROS ABS: 5.3 10*3/uL (ref 1.7–7.7)
NEUTROS PCT: 83 %
Platelets: 94 10*3/uL — ABNORMAL LOW (ref 150–400)
RBC: 3.59 MIL/uL — ABNORMAL LOW (ref 4.22–5.81)
RDW: 17.5 % — ABNORMAL HIGH (ref 11.5–15.5)
WBC: 6.5 10*3/uL (ref 4.0–10.5)

## 2018-04-16 LAB — ECHOCARDIOGRAM COMPLETE
HEIGHTINCHES: 71 in
WEIGHTICAEL: 2240 [oz_av]

## 2018-04-16 LAB — MAGNESIUM: MAGNESIUM: 1.6 mg/dL — AB (ref 1.7–2.4)

## 2018-04-16 LAB — T4, FREE: FREE T4: 1 ng/dL (ref 0.82–1.77)

## 2018-04-16 LAB — VITAMIN B12: Vitamin B-12: 438 pg/mL (ref 180–914)

## 2018-04-16 LAB — TROPONIN I: TROPONIN I: 0.03 ng/mL — AB (ref <0.03)

## 2018-04-16 LAB — TSH: TSH: 3.078 u[IU]/mL (ref 0.350–4.500)

## 2018-04-16 LAB — AMMONIA: AMMONIA: 33 umol/L (ref 9–35)

## 2018-04-16 MED ORDER — METOPROLOL TARTRATE 5 MG/5ML IV SOLN
2.5000 mg | INTRAVENOUS | Status: DC | PRN
  Administered 2018-04-16: 2.5 mg via INTRAVENOUS
  Filled 2018-04-16 (×2): qty 5

## 2018-04-16 MED ORDER — LORAZEPAM 2 MG/ML IJ SOLN
1.0000 mg | Freq: Once | INTRAMUSCULAR | Status: AC
  Administered 2018-04-16: 1 mg via INTRAVENOUS

## 2018-04-16 MED ORDER — MAGNESIUM SULFATE 2 GM/50ML IV SOLN
2.0000 g | Freq: Once | INTRAVENOUS | Status: AC
  Administered 2018-04-16: 2 g via INTRAVENOUS
  Filled 2018-04-16: qty 50

## 2018-04-16 MED ORDER — SODIUM CHLORIDE 0.9 % IV SOLN
500.0000 mg | INTRAVENOUS | Status: AC
  Administered 2018-04-16 – 2018-04-17 (×2): 500 mg via INTRAVENOUS
  Filled 2018-04-16 (×2): qty 500

## 2018-04-16 MED ORDER — LORAZEPAM 2 MG/ML IJ SOLN
0.5000 mg | INTRAMUSCULAR | Status: DC | PRN
  Administered 2018-04-16 – 2018-04-18 (×4): 0.5 mg via INTRAVENOUS
  Filled 2018-04-16 (×5): qty 1

## 2018-04-16 MED ORDER — ALBUTEROL SULFATE (2.5 MG/3ML) 0.083% IN NEBU
2.5000 mg | INHALATION_SOLUTION | Freq: Three times a day (TID) | RESPIRATORY_TRACT | Status: DC
  Administered 2018-04-16 – 2018-04-18 (×8): 2.5 mg via RESPIRATORY_TRACT
  Filled 2018-04-16 (×9): qty 3

## 2018-04-16 MED ORDER — HALOPERIDOL LACTATE 5 MG/ML IJ SOLN
INTRAMUSCULAR | Status: AC
  Filled 2018-04-16: qty 1

## 2018-04-16 MED ORDER — METOPROLOL TARTRATE 50 MG PO TABS: 50.0000 mg | ORAL_TABLET | Freq: Once | ORAL | Status: DC

## 2018-04-16 MED ORDER — SODIUM CHLORIDE 0.9 % IV SOLN
1.0000 g | INTRAVENOUS | Status: AC
  Administered 2018-04-16 – 2018-04-17 (×2): 1 g via INTRAVENOUS
  Filled 2018-04-16 (×2): qty 10

## 2018-04-16 MED ORDER — CEFDINIR 300 MG PO CAPS
300.0000 mg | ORAL_CAPSULE | Freq: Two times a day (BID) | ORAL | Status: DC
  Filled 2018-04-16: qty 1

## 2018-04-16 MED ORDER — QUETIAPINE FUMARATE 25 MG PO TABS
25.0000 mg | ORAL_TABLET | Freq: Once | ORAL | Status: AC
  Administered 2018-04-16: 25 mg via ORAL
  Filled 2018-04-16: qty 1

## 2018-04-16 MED ORDER — QUETIAPINE FUMARATE 25 MG PO TABS: 25.0000 mg | ORAL_TABLET | Freq: Every day | ORAL | Status: DC

## 2018-04-16 MED ORDER — METOPROLOL TARTRATE 5 MG/5ML IV SOLN
5.0000 mg | Freq: Four times a day (QID) | INTRAVENOUS | Status: DC
  Administered 2018-04-16 – 2018-04-18 (×7): 5 mg via INTRAVENOUS
  Filled 2018-04-16 (×5): qty 5

## 2018-04-16 MED ORDER — FUROSEMIDE 10 MG/ML IJ SOLN
40.0000 mg | Freq: Two times a day (BID) | INTRAMUSCULAR | Status: DC
  Administered 2018-04-16 – 2018-04-18 (×5): 40 mg via INTRAVENOUS
  Filled 2018-04-16 (×5): qty 4

## 2018-04-16 MED ORDER — FUROSEMIDE 10 MG/ML IJ SOLN
40.0000 mg | Freq: Once | INTRAMUSCULAR | Status: AC
  Administered 2018-04-16: 40 mg via INTRAVENOUS
  Filled 2018-04-16: qty 4

## 2018-04-16 MED ORDER — LORAZEPAM 2 MG/ML IJ SOLN
INTRAMUSCULAR | Status: AC
  Filled 2018-04-16: qty 1

## 2018-04-16 MED ORDER — METOPROLOL TARTRATE 50 MG PO TABS: 50.0000 mg | ORAL_TABLET | Freq: Two times a day (BID) | ORAL | Status: DC

## 2018-04-16 MED ORDER — METOPROLOL SUCCINATE ER 50 MG PO TB24: 75.0000 mg | ORAL_TABLET | Freq: Every day | ORAL | Status: DC

## 2018-04-16 MED ORDER — POTASSIUM CHLORIDE CRYS ER 20 MEQ PO TBCR
40.0000 meq | EXTENDED_RELEASE_TABLET | Freq: Every day | ORAL | Status: DC
  Administered 2018-04-17: 40 meq via ORAL
  Filled 2018-04-16: qty 2
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
## 2018-04-16 NOTE — Progress Notes (Signed)
RN visited pt to administer medication. Pt combative, pulling at telemetry and foley catheter. RN administered ativan and foley catheter removed per Posey Pronto MD recommendation. RN also informed Posey Pronto MD that pt refusing to take PO medications and pulling at IV. Patel MD stated to not give PO medications and attempt to give pt at least IV lasix and rocephin. RN verbalized understanding. All other medications not given. Condom catheter placed after ativan and lasix  administration.   RN continuing to monitor pt's need for restraints and his overall baseline.

## 2018-04-16 NOTE — Progress Notes (Signed)
PT Cancellation Note  Patient Details Name: Derrick Ayala MRN: [id number] DOB: 1935/05/12   Cancelled Treatment:    Reason Eval/Treat Not Completed: (P) Fatigue/lethargy limiting ability to participate Pt has been combative for the last 24 hours and is currently sedated. PT will follow back tomorrow.   Dorethea Strubel B. Migdalia Dk PT, DPT Acute Rehabilitation Services Pager [phone] Office [phone]    [place] 04/16/2018, 4:02 PM

## 2018-04-16 NOTE — Progress Notes (Addendum)
  Echocardiogram 2D Echocardiogram has been performed.  Patient non compliant.   Derrick Ayala L Androw 04/16/2018, 11:31 AM

## 2018-04-16 NOTE — Progress Notes (Signed)
RT called to 2W03 for assessment of patient. ABG drawn.

## 2018-04-16 NOTE — Progress Notes (Signed)
Tele informed RN of pt having 5 beats of V Tach. RN notified MD and assessed pt. No signs of discomfort or change in cognitive level.   RN will continue to monitor pt.

## 2018-04-16 NOTE — Progress Notes (Signed)
Palliative Medicine consult noted. Due to high referral volume, there may be a delay seeing this patient. Please call the Palliative Medicine Team office at [phone] if recommendations are needed in the interim.  Thank you for inviting [place] to see this patient.  Marjie Skiff Meha Vidrine, RN, BSN, [facility] Palliative Medicine Team 04/16/2018 2:21 PM Office [phone]

## 2018-04-16 NOTE — Progress Notes (Signed)
RN and Agricultural consultant responded to Bed alarm. Pt was agitated, combative, yelling and getting out of bed. Both RN's spent 15 minutes in the room trying to keep pt in the bed. After getting pt back in bed, RN noticed pt SOB using accessory muscles and complaining of chest pain. Pt started holding his chest stating "yall going to give me a Heart attack, keep bothering me". Both RNs tried to calm and redirect pt. While pt sitting on bed pt started looking ashened in the face and staring off in space. O2 placed on pt. Multiple RNs in the room at this time due to pt still being combative. Rapid Response called to come assess pt. See rapid response event note for further details.

## 2018-04-16 NOTE — Progress Notes (Signed)
PULMONARY / CRITICAL CARE MEDICINE   NAME:  Derrick Ayala, MRN:  [id number], DOB:  1934-09-27, LOS: 4 ADMISSION DATE:  04/01/2018, CONSULTATION DATE:  04/13/2018 REFERRING MD:  Dr. Posey Pronto, [facility] CHIEF COMPLAINT:  Cough  BRIEF HISTORY:    83 yo male former smoker presented with progressive cough with sputum.  Found to have mass like consolidation on CT chest.  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  HISTORY OF PRESENT ILLNESS   83 yo male has cough with sputum for past 6 months.  This has been getting worse.  Was bringing up green sputum with some blood streaking.  Not having fever, or chest pain.  Was seen by PCP.  Had CXR and then CT chest, and advised he needed treatment for pneumonia.  Started on ABx.  SIGNIFICANT PAST MEDICAL HISTORY   HTN, A fib, SCC Lt vocal cord s/p XRT  SIGNIFICANT EVENTS:  9/12 Admit  STUDIES:   CT chest 9/12 >> 5.4 cm subcarinal mass, b/l pleural calcifications, small b/l effusions, occluded LLL bronchus, 6.8 cm mass Lt infrahilar region, moderate centrilobular emphysema (reviewed by me)  CULTURES:  Blood 9/12 >> Sputum 9/12 >>   ANTIBIOTICS:  Rocephin 9/12 >> Zithromax 9/12 >>  LINES/TUBES:    CONSULTANTS:  Pulmonary 9/13  SUBJECTIVE:  Patient is very confused today, unable to answer questions in a meaningful manner, overnight events noted  CONSTITUTIONAL: BP (!) 145/117 (BP Location: Right Arm)   Pulse 73   Temp 97.7 F (36.5 C) (Oral)   Resp 20   Ht 5\' 11"  (1.803 m)   Wt 63.5 kg   SpO2 90%   BMI 19.53 kg/m   I/O last 3 completed shifts: In: 48 [P.O.:720; IV Piggyback:100] Out: 5 [Urine:3; Stool:2]        PHYSICAL EXAM:  General - Disheveled, confused Eyes - PERRL, EOM-I ENT - Intact Cardiac - RRR, Nl S1/S2 and -M/R/G Chest - Decreased BS at the bases L>R Abdomen - Soft, NT, ND and +BS Extremities - 2+ edema Skin - Intact Lymphatics - No LAN noted Neuro - Very confused, not following commands but moving  all ext spontaneously  Psych - Agitated and confused  I reviewed CT of the chest myself, LLL mass noted with infiltrate  ASSESSMENT AND PLAN    LLL lung mass, amenable to bronchoscopy for diagnostic purposes but patient is very confused and unable to consent and I visited twice today and no family is present.  I spoke with Dr. Posey Pronto, he reports that there is never any family around and that now they would like to stabilize the patient from a cardiac standpoint prior to consideration for bronchoscopy.  Lung Mass:  - Bronch if able to get consent and able to decide if meaningful decision will be based on the data obtained  - Stabilize from a cardiac standpoint first  Hypoxemia:  - Titrate O2 for sat of 88-92% only  - When closer to discharge will need an ambulatory desaturation study to determine home O2 need  PND:  - Sudafed  PCCM will be available when called by Dr. Posey Pronto for bronchoscopy  SUMMARY OF TODAY'S PLAN:  Finish abx treatment Need family to discuss plan of care Palliative care consult called  Best Practice / Goals of Care / Disposition.   DVT PROPHYLAXIS: SCDs SUP: Not indicated NUTRITION: soft diet MOBILITY: bed rest GOALS OF CARE: full code FAMILY DISCUSSIONS: no family at bedside  D/w Dr. Norlene Campbell  Glucose No results for input(s): GLUCAP in the last 168 hours.  BMET Recent Labs  Lab 04/13/18 1304 04/14/18 0405 04/16/18 0837  NA 136 139 139  K 3.1* 4.1 3.6  CL 101 103 102  CO2 26 29 22   BUN 7* 8 13  CREATININE 0.91 0.96 1.10  GLUCOSE 108* 109* 107*    Liver Enzymes Recent Labs  Lab 04/07/2018 1137 04/14/18 0405 04/16/18 0837  AST 23 18 28   ALT 15 14 18   ALKPHOS 66 71 71  BILITOT 1.5* 1.7* 1.5*  ALBUMIN 2.7* 2.7* 2.8*    Electrolytes Recent Labs  Lab 04/13/18 1304 04/14/18 0405 04/16/18 0837  CALCIUM 9.9 10.0 10.8*  MG 1.8 1.8 1.6*    CBC Recent Labs  Lab 04/13/18 0300 04/14/18 0405 04/16/18 0837  WBC 6.2 5.8 6.5  HGB  10.1* 10.5* 11.3*  HCT 30.8* 32.4* 35.1*  PLT 141* 134* 94*    ABG Recent Labs  Lab 04/16/18 0242  PHART 7.427  PCO2ART 36.0  PO2ART 129*    Coag's No results for input(s): APTT, INR in the last 168 hours.  Sepsis Markers Recent Labs  Lab 04/04/2018 1441  LATICACIDVEN 2.03*    Cardiac Enzymes Recent Labs  Lab 04/16/18 0837  TROPONINI 0.03*   Rush Farmer, M.D. [facility] Pulmonary/Critical Care Medicine. Pager: [phone]. After hours pager: [phone].  04/16/2018, 2:40 PM

## 2018-04-16 NOTE — Progress Notes (Signed)
SLP Cancellation Note  Patient Details Name: Derrick Ayala MRN: [id number] DOB: 1934/10/14   Cancelled treatment:       Reason Eval/Treat Not Completed: Medical issues which prohibited therapy. RN reported pt is in restraints and just received Ativan. Will continue efforts for swallow assessment tomorrow.    Derrick Ayala 04/16/2018, 1:47 PM   Orbie Pyo Colvin Caroli.Ed Risk analyst [phone] Office [phone]

## 2018-04-17 ENCOUNTER — Inpatient Hospital Stay (HOSPITAL_COMMUNITY): Payer: Medicare Other

## 2018-04-17 LAB — CULTURE, BLOOD (ROUTINE X 2)
Culture: NO GROWTH
Culture: NO GROWTH
Special Requests: ADEQUATE

## 2018-04-17 LAB — MAGNESIUM
MAGNESIUM: 1.9 mg/dL (ref 1.7–2.4)
Magnesium: 1.7 mg/dL (ref 1.7–2.4)

## 2018-04-17 LAB — BASIC METABOLIC PANEL
ANION GAP: 12 (ref 5–15)
Anion gap: 14 (ref 5–15)
BUN: 14 mg/dL (ref 8–23)
BUN: 14 mg/dL (ref 8–23)
CALCIUM: 10.9 mg/dL — AB (ref 8.9–10.3)
CHLORIDE: 100 mmol/L (ref 98–111)
CO2: 28 mmol/L (ref 22–32)
CO2: 28 mmol/L (ref 22–32)
CREATININE: 1.11 mg/dL (ref 0.61–1.24)
Calcium: 11.1 mg/dL — ABNORMAL HIGH (ref 8.9–10.3)
Chloride: 101 mmol/L (ref 98–111)
Creatinine, Ser: 1.09 mg/dL (ref 0.61–1.24)
GFR calc non Af Amer: >60 mL/min (ref >60)
Glucose, Bld: 95 mg/dL (ref 70–99)
Glucose, Bld: 110 mg/dL — ABNORMAL HIGH (ref 70–99)
Potassium: 3 mmol/L — ABNORMAL LOW (ref 3.5–5.1)
Potassium: 3.7 mmol/L (ref 3.5–5.1)
SODIUM: 141 mmol/L (ref 135–145)
Sodium: 142 mmol/L (ref 135–145)

## 2018-04-17 LAB — CBC
HEMATOCRIT: 36.2 % — AB (ref 39.0–52.0)
Hemoglobin: 11.7 g/dL — ABNORMAL LOW (ref 13.0–17.0)
MCH: 31 pg (ref 26.0–34.0)
MCHC: 32.3 g/dL (ref 30.0–36.0)
MCV: 95.8 fL (ref 78.0–100.0)
PLATELETS: 141 10*3/uL — AB (ref 150–400)
RBC: 3.78 MIL/uL — ABNORMAL LOW (ref 4.22–5.81)
RDW: 17.2 % — AB (ref 11.5–15.5)
WBC: 7.4 10*3/uL (ref 4.0–10.5)

## 2018-04-17 LAB — URINE CULTURE: Culture: NO GROWTH

## 2018-04-17 MED ORDER — AMIODARONE HCL IN DEXTROSE 360-4.14 MG/200ML-% IV SOLN
30.0000 mg/h | INTRAVENOUS | Status: DC
  Administered 2018-04-17 – 2018-04-18 (×2): 30 mg/h via INTRAVENOUS
  Filled 2018-04-17 (×3): qty 200

## 2018-04-17 MED ORDER — AMIODARONE HCL 200 MG PO TABS: 400.0000 mg | ORAL_TABLET | Freq: Two times a day (BID) | ORAL | Status: DC

## 2018-04-17 MED ORDER — AMIODARONE HCL IN DEXTROSE 360-4.14 MG/200ML-% IV SOLN
60.0000 mg/h | INTRAVENOUS | Status: DC
  Administered 2018-04-17: 60 mg/h via INTRAVENOUS
  Filled 2018-04-17: qty 200

## 2018-04-17 MED ORDER — AMIODARONE HCL 200 MG PO TABS: 400.0000 mg | ORAL_TABLET | Freq: Every day | ORAL | Status: DC

## 2018-04-17 MED ORDER — AMIODARONE LOAD VIA INFUSION
150.0000 mg | Freq: Once | INTRAVENOUS | Status: AC
  Administered 2018-04-17: 150 mg via INTRAVENOUS
  Filled 2018-04-17: qty 83.34

## 2018-04-17 MED ORDER — DILTIAZEM LOAD VIA INFUSION: 10.0000 mg | Freq: Once | INTRAVENOUS | Status: DC

## 2018-04-17 MED ORDER — POTASSIUM CHLORIDE CRYS ER 20 MEQ PO TBCR: 40.0000 meq | EXTENDED_RELEASE_TABLET | Freq: Once | ORAL | Status: DC

## 2018-04-17 MED ORDER — POTASSIUM CHLORIDE 10 MEQ/100ML IV SOLN
10.0000 meq | INTRAVENOUS | Status: DC
  Administered 2018-04-17 (×4): 10 meq via INTRAVENOUS
  Filled 2018-04-17: qty 100
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
## 2018-04-17 NOTE — Progress Notes (Signed)
Modified Barium Swallow Progress Note  Patient Details  Name: Taytum Wheller MRN: [id number] Date of Birth: 13-Apr-1935  Today's Date: 04/17/2018  Modified Barium Swallow completed.  Full report located under Chart Review in the Imaging Section.  Brief recommendations include the following:  Clinical Impression  Pt has a moderate-severe oropharyngeal and cervical esophageal, with chronic component suspected given h/o laryngeal cancer s/p XRT. His lingual manipulation for bolus cohesion and A/P transfer is limited, resulting in slow bolus movement, decreased cohesion, and lingual residue.   Pharyngeally he has significantly impaired base of tongue retraction, pharyngeal constriction, hyolaryngeal movement, and UES relaxation. Pt therefore has reduced epiglottic deflection, airway protection, and pharyngeal clearance. Pt has consitent penetration with thin liquids that he appears to sense once they hit the true vocal folds, but his spontaneous cough is not effective at clearing the airway and cognitively he does not always cough to command. Aspiration did occur with thin liquids as barium could not be cleared. As consistencies become thicker they offer increased airway protection during the swallow; however, they also result in increasing amounts of residue that is penetrated then after the swallow. After only a few bites of puree, pt's pyriform sinuses and valleculae were filled with barium, which could never be completely cleared despite attempted strategies, use of yankauer/attempts at expectoration, and liquid rinses.   Pt is at a high risk for poor nutrition/hydration as well as aspiration. What is unclear is how this compared to his baseline. If there is some acute decompensation then there could be some potential for recovery; however, if this is near baseline as suspected from [facility], pt will likely benefit from [place] discussion regarding aspiration risk. Would maintain NPO status for now except for few ice  chips, administered by RN only after oral care.   Swallow Evaluation Recommendations       SLP Diet Recommendations: NPO;Ice chips PRN after oral care       Medication Administration: Via alternative means               Oral Care Recommendations: Oral care QID   Other Recommendations: Have oral suction available    Germain Osgood 04/17/2018,2:20 PM   Germain Osgood, M.A. [place] Acute Environmental education officer [phone] Office [phone]

## 2018-04-17 NOTE — Evaluation (Signed)
Clinical/Bedside Swallow Evaluation Patient Details  Name: Derrick Ayala MRN: [id number] Date of Birth: 26-Mar-1935  Today's Date: 04/17/2018 Time: SLP Start Time (ACUTE ONLY): 1002 SLP Stop Time (ACUTE ONLY): 1017 SLP Time Calculation (min) (ACUTE ONLY): 15 min  Past Medical History:  Past Medical History:  Diagnosis Date  . Hypertension   . Hypokalemia   . New onset atrial fibrillation ([place]) 01/18/2018   startd on Eliquis recently  . RBBB 01/18/2018  . 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 COLONOSCOPY WITH PROPOFOL N/A 10/19/2017   Procedure: COLONOSCOPY WITH PROPOFOL;  Surgeon: Laurence Spates, MD;  Location: [facility];  Service: Endoscopy;  Laterality: N/A;  . ESOPHAGOGASTRODUODENOSCOPY (EGD) WITH PROPOFOL N/A 10/19/2017   Procedure: ESOPHAGOGASTRODUODENOSCOPY (EGD) WITH PROPOFOL;  Surgeon: Laurence Spates, MD;  Location: [facility];  Service: Endoscopy;  Laterality: N/A;  . HERNIA REPAIR     12-13 yrs ago   HPI:  Pt is an 83 yo male admitted on 04/30/2018 with SOB, found to have postobstructive pneumonia with a possible pulmonary mass. 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 fib, HTN. Pt has reported difficulty swallowing to providers in the past per chart review. He was ordered to have MBS in 2015 and again in June 2019, but he did not comt to either appointment.   Assessment / Plan / Recommendation Clinical Impression  Upon arrival pt had audible, wet vocal quality. Upon cues to cough, he expectorated moderate amounts of what appeared to be applesauce with medicine crushed in it. SLP provided few ice chips trials which elicited audible swallows and similar wet vocal quality. As pt performed volitional coughs, he continued to expectorate more applesauce.  Oral suction was utilized until pt's coughing was no longer productive. Although pt cannot provide a clear history, chart review suggests that he has been having some level of dysphagia since XRT in 2015. OP MBS has been ordered at least x2 but unfortunately pt no showed both times. Recommend proceeding with MBS at this time to better assess oropharyngeal swallow and assist in determining if any POs may be safe. Would maintain NPO status with meds via alternative means pending completion. SLP Visit Diagnosis: Dysphagia, unspecified (R13.10)    Aspiration Risk  Severe aspiration risk    Diet Recommendation NPO   Medication Administration: Via alternative means    Other  Recommendations Oral Care Recommendations: Oral care QID   Follow up Recommendations (tba)      Frequency and Duration            Prognosis Prognosis for Safe Diet Advancement: Fair Barriers to Reach Goals: Cognitive deficits;Severity of deficits;Time post onset      Swallow Study   General HPI: Pt is an 83 yo male admitted on 04/10/2018 with SOB, found to have postobstructive pneumonia with a possible pulmonary mass. 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 fib, HTN. Pt has reported difficulty swallowing to providers in the past per chart review. He was ordered to have MBS in 2015 and again in June 2019, but he did not comt to either appointment. Type of Study: Bedside Swallow Evaluation Previous Swallow Assessment: none in chart - see HPI Diet Prior to this Study: NPO;Other (Comment)(except meds in applesauce) Temperature Spikes Noted:  No Respiratory Status: Room air History of Recent Intubation: No Behavior/Cognition: Alert;Cooperative;Confused;Requires cueing Oral Cavity Assessment: Within Functional Limits Oral Care Completed by SLP: No Oral Cavity - Dentition: Poor condition Patient Positioning: Upright in bed Baseline Vocal Quality: Wet Volitional Cough: Strong Volitional Swallow:  Unable to elicit    Oral/Motor/Sensory Function Overall Oral Motor/Sensory Function: Within functional limits   Ice Chips Ice chips: Impaired Presentation: Spoon Pharyngeal Phase Impairments: Wet Vocal Quality;Other (comments)(expectoration; audible swallows)   Thin Liquid Thin Liquid: Not tested    Nectar Thick Nectar Thick Liquid: Not tested   Honey Thick Honey Thick Liquid: Not tested   Puree Puree: Impaired Presentation: Spoon Pharyngeal Phase Impairments: Wet Vocal Quality;Other (comments)(expectoration)   Solid     Solid: Not tested      Derrick Ayala 04/17/2018,10:44 AM  Derrick Ayala, M.A. [place] Acute Environmental education officer [phone] Office [phone]

## 2018-04-17 NOTE — Progress Notes (Signed)
[facility] Progress Note  Patient: Derrick Ayala WEX:[id number]   PCP: Lujean Amel, MD DOB: November 03, 1934   DOA: 04/13/2018   DOS: 04/17/2018   Date of Service: the patient was seen and examined on 04/17/2018  Subjective: Still remaining in restraint.  Somewhat agitated morning and required IV Ativan.  Now asleep at the time of my evaluation. Able to later in the afternoon patient was awake and was working with physical therapy as well as following up with cardiology. No further agitation noted during the day.  Brief hospital course: Pt. 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 fib, HTN; admitted on 04/14/2018, presented with complaint of shortness of breath, was found to have postobstructive pneumonia with a possible pulmonary mass.  Pulmonary was consulted and patient was hopefully going for bronchoscopy once his pneumonia settles.  Over the weekend patient went A. fib with RVR as well as had acute encephalopathy.  Further work-up showed acute systolic CHF.  Cardiology consulted. Currently further plan is further work-up for the pulmonary mass but before that stabilization and treatment of the pneumonia and CHF.  Assessment and Plan: 1.  Community-acquired pneumonia. Possible postobstructive etiology. Presented with shortness of breath, CT scan and chest x-ray are showing left lower lobe pneumonia versus a possible mass. No significant leukocytosis or fever but patient with copious amount of expectoration. For now continue with IV ceftriaxone and azithromycin.  Will complete 7-day treatment course.  1 more day remaining. So far cultures are negative. Pulmonary toilet, flutter valve, Mucinex, Tessalon Perles.  2.  Pulmonary mass. Left lower lobe pneumonia is appearing more likely a possible necrotic lung mass. Patient also has subcarinal lung mass as well. Pulmonary consulted. Currently holding Eliquis in order to get the patient ready for possible  procedure. Concern is that the patient is unstable cardiac wise for a bronchoscopy therefore will hold off on any elective procedure for now.  3.  A. fib. Mild RVR. CHA?DS?-VASc 3 Patient on chronic anticoagulation with Eliquis. Currently on hold.   Lovenox for DVT prophylaxis. Patient on Lopressor at home 25 mg twice daily, initial plan is to switch to Toprol.  Unable to take anything by mouth therefore oral medications are on hold.  Cardiology is transitioning the patient to IV amiodarone.  PRN Lopressor.  4.  Acute encephalopathy. Overnight on 04/16/2018 the patient become very confused.  Agitated as well. This appears to be more delirium in the setting of prolonged hospital stay. Hypoxia can also be the etiology of patient's confusion. CT head unremarkable for any acute abnormality. Ammonia 33, free T4 1.0, B12 438, TSH 3.07, LFTs unremarkable Currently needing restraints as the patient is pulling on lines and tubes. Avoid using QT prolonging medications, unless necessary, especially while the patient is on amiodarone. QT is 530 although part of it is reflecting RBBB with prolonged QRS. Using PRN Ativan.  5.  Acute systolic CHF, on chronic diastolic CHF. Bilateral pedal edema. On Lasix at home. Due to severe shortness of breath overnight Echo performed which shows 35% EF which is acute drop.  With diffuse hypokinesis. Cardiology consulted, currently differential includes tachycardia induced cardiomyopathy as versus CAD. Management the more conservative with rate control and volume management.  No ischemic work-up for now, recommend outpatient repeat echocardiogram in 3 to 6 months. Also TED stockings.  Appreciate cardiology input  6.  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  7.  BPH. Continue Flomax.  8.  Protein calorie malnutrition. Moderate. In the setting of possible malignancy. Nutrition supplementation.  9.  Foley  catheter insertion. Foley catheter was inserted last night due to patient's critical condition with shortness of breath and agitation along with A. fib with RVR and hypoxia. Currently now the patient is more stable and Foley catheter has been discontinued.  10. Hypokalemia. Hypomagnesemia Replaced.  Will recheck.  11.  Dysphagia. Patient does have evidence of dysphagia based on speech evaluation. Likely multifactorial with a combination of acute encephalopathy, medication effect with Ativan as well as history of tongue cancer. Patient will be undergoing modified barium swallow evaluation.  Diet: N.p.o. DVT Prophylaxis: subcutaneous Heparin  Advance goals of care discussion: Full code  Family Communication: family was present at bedside, at the time of interview.  Daughter and grandson were at bedside.  All questions answered.  Disposition:  Discharge to be determined .  Consultants: PCCM , cardiology, palliative care Procedures: Echocardiogram   Antibiotics: Anti-infectives (From admission, onward)   Start     Dose/Rate Route Frequency Ordered Stop   04/16/18 1500  cefTRIAXone (ROCEPHIN) 1 g in sodium chloride 0.9 % 100 mL IVPB     1 g 200 mL/hr over 30 Minutes Intravenous Every 24 hours 04/16/18 1356 04/17/18 1810   04/16/18 1500  azithromycin (ZITHROMAX) 500 mg in sodium chloride 0.9 % 250 mL IVPB     500 mg 250 mL/hr over 60 Minutes Intravenous Every 24 hours 04/16/18 1356 04/17/18 1722   04/16/18 1000  cefdinir (OMNICEF) capsule 300 mg  Status:  Discontinued     300 mg Oral Every 12 hours 04/16/18 0834 04/16/18 1356   04/15/18 1800  azithromycin (ZITHROMAX) tablet 500 mg  Status:  Discontinued     500 mg Oral Daily-1800 04/15/18 1322 04/16/18 1356   04/03/2018 2000  cefTRIAXone (ROCEPHIN) 1 g in sodium chloride 0.9 % 100 mL IVPB  Status:  Discontinued     1 g 200 mL/hr over 30 Minutes Intravenous Every 24 hours 04/27/2018 1550 04/16/18 0834   04/01/2018 1800  azithromycin  (ZITHROMAX) 500 mg in sodium chloride 0.9 % 250 mL IVPB  Status:  Discontinued     500 mg 250 mL/hr over 60 Minutes Intravenous Every 24 hours 04/23/2018 1550 04/15/18 1322   04/13/2018 1330  piperacillin-tazobactam (ZOSYN) IVPB 3.375 g     3.375 g 100 mL/hr over 30 Minutes Intravenous  Once 04/18/2018 1324 04/11/2018 1555       Objective: Physical Exam: Vitals:   04/17/18 0728 04/17/18 0818 04/17/18 1356 04/17/18 1559  BP: (!) 127/109   (!) 141/98  Pulse: (!) 57   98  Resp: 20   (!) 22  Temp: 97.6 F (36.4 C)   (!) 96.8 F (36 C)  TempSrc: Axillary   Axillary  SpO2: 96% 97% 98% 98%  Weight:      Height:        Intake/Output Summary (Last 24 hours) at 04/17/2018 1812 Last data filed at 04/17/2018 1700 Gross per 24 hour  Intake 10 ml  Output 2500 ml  Net -2490 ml   Filed Weights   04/18/2018 1121  Weight: 63.5 kg   General: Alert, Awake and Oriented to Person. Appear in mild distress, affect anxious Eyes: PERRL, Conjunctiva normal ENT: Oral Mucosa clear moist. Neck: no JVD, no Abnormal Mass Or lumps Cardiovascular: S1 and S2 Present, no Murmur, Peripheral Pulses Present Respiratory: increased respiratory effort, Bilateral Air entry equal and Decreased, no use of accessory muscle, bilateral basal Crackles,  left Occasional wheezes Abdomen: Bowel Sound present, Soft and no tenderness, no hernia Skin: no redness, no Rash, no induration Extremities: bilateral Pedal edema, no calf tenderness Neurologic: Grossly no focal neuro deficit. Bilaterally Equal motor strength  Data Reviewed: CBC: Recent Labs  Lab 04/11/2018 1137 04/13/18 0300 04/14/18 0405 04/16/18 0837 04/17/18 0228  WBC 5.7 6.2 5.8 6.5 7.4  NEUTROABS 4.6 4.8 4.4 5.3  --   HGB 10.7* 10.1* 10.5* 11.3* 11.7*  HCT 33.4* 30.8* 32.4* 35.1* 36.2*  MCV 95.4 96.0 97.3 97.8 95.8  PLT 159 141* 134* 94* 681*   Basic Metabolic Panel: Recent Labs  Lab 04/27/2018 1416 04/13/18 0300 04/13/18 1304 04/14/18 0405 04/16/18 0837  04/17/18 0228  NA  --  138 136 139 139 141  K  --  2.8* 3.1* 4.1 3.6 3.0*  CL  --  102 101 103 102 101  CO2  --  30 26 29 22 28   GLUCOSE  --  114* 108* 109* 107* 95  BUN  --  7* 7* 8 13 14   CREATININE  --  0.97 0.91 0.96 1.10 1.09  CALCIUM  --  9.8 9.9 10.0 10.8* 10.9*  MG 1.4*  --  1.8 1.8 1.6* 1.9    Liver Function Tests: Recent Labs  Lab 04/11/2018 1137 04/14/18 0405 04/16/18 0837  AST 23 18 28   ALT 15 14 18   ALKPHOS 66 71 71  BILITOT 1.5* 1.7* 1.5*  PROT 6.6 6.6 7.2  ALBUMIN 2.7* 2.7* 2.8*   No results for input(s): LIPASE, AMYLASE in the last 168 hours. Recent Labs  Lab 04/16/18 0836  AMMONIA 33   Coagulation Profile: No results for input(s): INR, PROTIME in the last 168 hours. Cardiac Enzymes: Recent Labs  Lab 04/16/18 0837  TROPONINI 0.03*   BNP (last 3 results) No results for input(s): PROBNP in the last 8760 hours. CBG: No results for input(s): GLUCAP in the last 168 hours. Studies: Dg Swallowing Func-speech Pathology  Result Date: 04/17/2018 Objective Swallowing Evaluation: Type of Study: MBS-Modified Barium Swallow Study  Patient Details Name: Derrick Ayala MRN: [id number] Date of Birth: 1934/09/07 Today's Date: 04/17/2018 Time: SLP Start Time (ACUTE ONLY): 1206 -SLP Stop Time (ACUTE ONLY): 1229 SLP Time Calculation (min) (ACUTE ONLY): 23 min Past Medical History: Past Medical History: Diagnosis Date . Hypertension  . Hypokalemia  . New onset atrial fibrillation ([place]) 01/18/2018  startd on Eliquis recently . RBBB 01/18/2018 . 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 COLONOSCOPY WITH PROPOFOL N/A 10/19/2017  Procedure: COLONOSCOPY WITH PROPOFOL;  Surgeon: Laurence Spates, MD;  Location: [facility];  Service: Endoscopy;  Laterality: N/A; . ESOPHAGOGASTRODUODENOSCOPY (EGD) WITH  PROPOFOL N/A 10/19/2017  Procedure: ESOPHAGOGASTRODUODENOSCOPY (EGD) WITH PROPOFOL;  Surgeon: Laurence Spates, MD;  Location: [facility];  Service: Endoscopy;  Laterality: N/A; . HERNIA REPAIR    12-13 yrs ago HPI: Pt is an 83 yo male admitted on 04/01/2018 with SOB, found to have postobstructive pneumonia with a possible pulmonary mass. 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 fib, HTN. Pt has reported difficulty swallowing to providers in the past per chart review. He was ordered to have MBS in 2015 and again in June 2019, but he did not comt to either appointment.  Subjective: alert but confused, difficulty providing history Assessment / Plan / Recommendation CHL  IP CLINICAL IMPRESSIONS 04/17/2018 Clinical Impression Pt has a moderate-severe oropharyngeal and cervical esophageal, with chronic component suspected given h/o laryngeal cancer s/p XRT. His lingual manipulation for bolus cohesion and A/P transfer is limited, resulting in slow bolus movement, decreased cohesion, and lingual residue.  Pharyngeally he has significantly impaired base of tongue retraction, pharyngeal constriction, hyolaryngeal movement, and UES relaxation. Pt therefore has reduced epiglottic deflection, airway protection, and pharyngeal clearance. Pt has consitent penetration with thin liquids that he appears to sense once they hit the true vocal folds, but his spontaneous cough is not effective at clearing the airway and cognitively he does not always cough to command. Aspiration did occur with thin liquids as barium could not be cleared. As consistencies become thicker they offer increased airway protection during the swallow; however, they also result in increasing amounts of residue that is penetrated then after the swallow. After only a few bites of puree, pt's pyriform sinuses and valleculae were filled with barium, which could never be completely cleared despite attempted strategies, use of yankauer/attempts at  expectoration, and liquid rinses. Pt is at a high risk for poor nutrition/hydration as well as aspiration. What is unclear is how this compared to his baseline. If there is some acute decompensation then there could be some potential for recovery; however, if this is near baseline as suspected from [facility], pt will likely benefit from [place] discussion regarding aspiration risk. Would maintain NPO status for now except for few ice chips, administered by RN only after oral care.  SLP Visit Diagnosis Dysphagia, oropharyngeal phase (R13.12);Dysphagia, pharyngoesophageal phase (R13.14) Attention and concentration deficit following -- Frontal lobe and executive function deficit following -- Impact on safety and function Severe aspiration risk;Risk for inadequate nutrition/hydration   CHL IP TREATMENT RECOMMENDATION 04/17/2018 Treatment Recommendations Therapy as outlined in treatment plan below   Prognosis 04/17/2018 Prognosis for Safe Diet Advancement Guarded Barriers to Reach Goals Cognitive deficits;Severity of deficits;Time post onset Barriers/Prognosis Comment -- CHL IP DIET RECOMMENDATION 04/17/2018 SLP Diet Recommendations NPO;Ice chips PRN after oral care Liquid Administration via -- Medication Administration Via alternative means Compensations -- Postural Changes --   CHL IP OTHER RECOMMENDATIONS 04/17/2018 Recommended Consults -- Oral Care Recommendations Oral care QID Other Recommendations Have oral suction available   CHL IP FOLLOW UP RECOMMENDATIONS 04/17/2018 Follow up Recommendations (No Data)   CHL IP FREQUENCY AND DURATION 04/17/2018 Speech Therapy Frequency (ACUTE ONLY) min 2x/week Treatment Duration 2 weeks      CHL IP ORAL PHASE 04/17/2018 Oral Phase Impaired Oral - Pudding Teaspoon -- Oral - Pudding Cup -- Oral - Honey Teaspoon -- Oral - Honey Cup -- Oral - Nectar Teaspoon -- Oral - Nectar Cup Weak lingual manipulation;Reduced posterior propulsion;Lingual/palatal residue;Decreased bolus cohesion;Premature spillage  Oral - Nectar Straw -- Oral - Thin Teaspoon Weak lingual manipulation;Reduced posterior propulsion;Lingual/palatal residue;Decreased bolus cohesion;Premature spillage Oral - Thin Cup Weak lingual manipulation;Reduced posterior propulsion;Lingual/palatal residue;Decreased bolus cohesion;Premature spillage Oral - Thin Straw -- Oral - Puree Weak lingual manipulation;Reduced posterior propulsion;Lingual/palatal residue;Decreased bolus cohesion Oral - Mech Soft -- Oral - Regular -- Oral - Multi-Consistency -- Oral - Pill -- Oral Phase - Comment --  CHL IP PHARYNGEAL PHASE 04/17/2018 Pharyngeal Phase Impaired Pharyngeal- Pudding Teaspoon -- Pharyngeal -- Pharyngeal- Pudding Cup -- Pharyngeal -- Pharyngeal- Honey Teaspoon -- Pharyngeal -- Pharyngeal- Honey Cup -- Pharyngeal -- Pharyngeal- Nectar Teaspoon -- Pharyngeal -- Pharyngeal- Nectar Cup Reduced pharyngeal peristalsis;Reduced epiglottic inversion;Reduced anterior laryngeal mobility;Reduced laryngeal elevation;Reduced airway/laryngeal closure;Reduced tongue base retraction;Pharyngeal residue - valleculae;Pharyngeal residue -  pyriform;Penetration/Aspiration during swallow;Penetration/Apiration after swallow Pharyngeal Material enters airway, remains ABOVE vocal cords and not ejected out Pharyngeal- Nectar Straw -- Pharyngeal -- Pharyngeal- Thin Teaspoon Reduced pharyngeal peristalsis;Reduced epiglottic inversion;Reduced anterior laryngeal mobility;Reduced laryngeal elevation;Reduced airway/laryngeal closure;Reduced tongue base retraction;Pharyngeal residue - valleculae;Pharyngeal residue - pyriform;Penetration/Aspiration during swallow Pharyngeal Material enters airway, remains ABOVE vocal cords and not ejected out Pharyngeal- Thin Cup Reduced pharyngeal peristalsis;Reduced epiglottic inversion;Reduced anterior laryngeal mobility;Reduced laryngeal elevation;Reduced airway/laryngeal closure;Reduced tongue base retraction;Pharyngeal residue - valleculae;Pharyngeal residue  - pyriform;Penetration/Aspiration during swallow;Penetration/Apiration after swallow Pharyngeal Material enters airway, passes BELOW cords and not ejected out despite cough attempt by patient Pharyngeal- Thin Straw -- Pharyngeal -- Pharyngeal- Puree Reduced pharyngeal peristalsis;Reduced epiglottic inversion;Reduced anterior laryngeal mobility;Reduced laryngeal elevation;Reduced airway/laryngeal closure;Reduced tongue base retraction;Pharyngeal residue - valleculae;Pharyngeal residue - pyriform;Penetration/Apiration after swallow Pharyngeal Material enters airway, remains ABOVE vocal cords and not ejected out Pharyngeal- Mechanical Soft -- Pharyngeal -- Pharyngeal- Regular -- Pharyngeal -- Pharyngeal- Multi-consistency -- Pharyngeal -- Pharyngeal- Pill -- Pharyngeal -- Pharyngeal Comment --  CHL IP CERVICAL ESOPHAGEAL PHASE 04/17/2018 Cervical Esophageal Phase Impaired Pudding Teaspoon -- Pudding Cup -- Honey Teaspoon -- Honey Cup -- Nectar Teaspoon -- Nectar Cup Reduced cricopharyngeal relaxation Nectar Straw -- Thin Teaspoon Reduced cricopharyngeal relaxation Thin Cup Reduced cricopharyngeal relaxation Thin Straw -- Puree Reduced cricopharyngeal relaxation Mechanical Soft -- Regular -- Multi-consistency -- Pill -- Cervical Esophageal Comment -- Germain Osgood 04/17/2018, 2:24 PM  Germain Osgood, M.A. [place] [facility] Pager [phone] Office [phone]              Scheduled Meds: . albuterol  2.5 mg Nebulization TID  . [START ON 04/18/2018] amiodarone  400 mg Oral Q12H   Followed by  . [START ON 04/25/2018] amiodarone  400 mg Oral Daily  . enoxaparin (LOVENOX) injection  40 mg Subcutaneous Q24H  . furosemide  40 mg Intravenous BID  . metoprolol tartrate  5 mg Intravenous Q6H  . sodium chloride  2 spray Each Nare Daily   Continuous Infusions: . amiodarone 60 mg/hr (04/17/18 1625)   Followed by  . amiodarone     PRN Meds:   Time spent: 35 minutes  Author: Berle Mull, MD [facility] Pager: [phone] 04/17/2018 6:12 PM  If 7PM-7AM, please contact night-coverage at [email or web address], password [facility]

## 2018-04-17 NOTE — Progress Notes (Signed)
  Amiodarone Drug - Drug Interaction Consult Note  Recommendations: -Watch for Qtc prolongation while on azithro (only 2 more doses remaining. -Monitor for increased risk of bradycardia, AV block and myocardial depression on BB. -Watch electrolytes on lasix.  Amiodarone is metabolized by the cytochrome P450 system and therefore has the potential to cause many drug interactions. Amiodarone has an average plasma half-life of 50 days (range 20 to 100 days).   There is potential for drug interactions to occur several weeks or months after stopping treatment and the onset of drug interactions may be slow after initiating amiodarone.   []  Statins: Increased risk of myopathy. Simvastatin- restrict dose to 20mg  daily. Other statins: counsel patients to report any muscle pain or weakness immediately.  []  Anticoagulants: Amiodarone can increase anticoagulant effect. Consider warfarin dose reduction. Patients should be monitored closely and the dose of anticoagulant altered accordingly, remembering that amiodarone levels take several weeks to stabilize.  []  Antiepileptics: Amiodarone can increase plasma concentration of phenytoin, the dose should be reduced. Note that small changes in phenytoin dose can result in large changes in levels. Monitor patient and counsel on signs of toxicity.  [x]  Beta blockers: increased risk of bradycardia, AV block and myocardial depression. Sotalol - avoid concomitant use.  []   Calcium channel blockers (diltiazem and verapamil): increased risk of bradycardia, AV block and myocardial depression.  []   Cyclosporine: Amiodarone increases levels of cyclosporine. Reduced dose of cyclosporine is recommended.  []  Digoxin dose should be halved when amiodarone is started.  [x]  Diuretics: increased risk of cardiotoxicity if hypokalemia occurs.  []  Oral hypoglycemic agents (glyburide, glipizide, glimepiride): increased risk of hypoglycemia. Patient's glucose levels should be  monitored closely when initiating amiodarone therapy.   [x]  Drugs that prolong the QT interval:  Torsades de pointes risk may be increased with concurrent use - avoid if possible.  Monitor QTc, also keep magnesium/potassium WNL if concurrent therapy can't be avoided. Marland Kitchen Antibiotics: e.g. fluoroquinolones, erythromycin. . Antiarrhythmics: e.g. quinidine, procainamide, disopyramide, sotalol. . Antipsychotics: e.g. phenothiazines, haloperidol.  . Lithium, tricyclic antidepressants, and methadone. Thank You,  Elicia Lamp P  04/17/2018 2:30 PM

## 2018-04-17 NOTE — Progress Notes (Signed)
Physical Therapy Treatment Patient Details Name: Derrick Ayala MRN: [id number] DOB: [birth year] Today's Date: 04/17/2018    History of Present Illness 83 year old male here with generalized shortness of breath and cough.  CT scan from yesterday reviewed and is concerning for consolidative pneumonia with possible obstructive mass.     PT Comments    On initial entry pt stated "I want to go home." Able to direct pt to LE AROM in order to work on strengthening. However, pt became agitated after 2 sets of exercise and could not be redirected. Pt began yelling about "things that happened in the night." PT ended session. Given pt change in cognition since evaluation, PT now recommends SNF level rehab at discharge.    Follow Up Recommendations  SNF     Equipment Recommendations  Other (comment)(TBD at next venue)    Recommendations for Other Services       Precautions / Restrictions Precautions Precautions: Fall Restrictions Weight Bearing Restrictions: No          Cognition Arousal/Alertness: Awake/alert Behavior During Therapy: Restless;Agitated Overall Cognitive Status: Impaired/Different from baseline Area of Impairment: Attention;Following commands;Awareness;Memory;Orientation;Safety/judgement                 Orientation Level: Place;Time;Situation Current Attention Level: Sustained Memory: Decreased recall of precautions;Decreased short-term memory Following Commands: Follows one step commands inconsistently Safety/Judgement: Decreased awareness of safety;Decreased awareness of deficits Awareness: Emergent   General Comments: pt initially following commands for exercise in LE, and then became agitated and could not be redirected to therapy       Exercises General Exercises - Lower Extremity Short Arc Quad: AROM;Both;5 reps Straight Leg Raises: AROM;Both;5 reps             PT Goals (current goals can now be found in the care plan section) Acute Rehab PT  Goals Patient Stated Goal: feel stronger PT Goal Formulation: With patient Time For Goal Achievement: 04/27/18 Potential to Achieve Goals: Good    Frequency    Min 3X/week      PT Plan Discharge plan needs to be updated       AM-PAC PT "6 Clicks" Daily Activity  Outcome Measure  Difficulty turning over in bed (including adjusting bedclothes, sheets and blankets)?: Unable Difficulty moving from lying on back to sitting on the side of the bed? : Unable Difficulty sitting down on and standing up from a chair with arms (e.g., wheelchair, bedside commode, etc,.)?: Unable Help needed moving to and from a bed to chair (including a wheelchair)?: Total Help needed walking in hospital room?: Total Help needed climbing 3-5 steps with a railing? : Total 6 Click Score: 6    End of Session Equipment Utilized During Treatment: Gait belt Activity Tolerance: Patient tolerated treatment well Patient left: in chair;with call bell/phone within reach;with chair alarm set Nurse Communication: Mobility status PT Visit Diagnosis: Other abnormalities of gait and mobility (R26.89);Muscle weakness (generalized) (M62.81);History of falling (Z91.81);Difficulty in walking, not elsewhere classified (R26.2)     Time: 1740-8144 PT Time Calculation (min) (ACUTE ONLY): 10 min  Charges:  $Therapeutic Exercise: 8-22 mins                     Derrick Ayala B. Migdalia Dk PT, DPT Acute Rehabilitation Services Pager [phone] Office [phone]    [place] 04/17/2018, 5:18 PM

## 2018-04-18 ENCOUNTER — Encounter (HOSPITAL_COMMUNITY): Payer: Self-pay | Admitting: Certified Registered"

## 2018-04-18 ENCOUNTER — Inpatient Hospital Stay (HOSPITAL_COMMUNITY): Payer: Medicare Other

## 2018-04-18 DIAGNOSIS — R739 Hyperglycemia, unspecified: Secondary | ICD-10-CM

## 2018-04-18 DIAGNOSIS — C320 Malignant neoplasm of glottis: Secondary | ICD-10-CM

## 2018-04-18 DIAGNOSIS — J9601 Acute respiratory failure with hypoxia: Secondary | ICD-10-CM

## 2018-04-18 DIAGNOSIS — G931 Anoxic brain damage, not elsewhere classified: Secondary | ICD-10-CM

## 2018-04-18 DIAGNOSIS — I469 Cardiac arrest, cause unspecified: Secondary | ICD-10-CM

## 2018-04-18 DIAGNOSIS — I10 Essential (primary) hypertension: Secondary | ICD-10-CM

## 2018-04-18 DIAGNOSIS — E43 Unspecified severe protein-calorie malnutrition: Secondary | ICD-10-CM

## 2018-04-18 DIAGNOSIS — R570 Cardiogenic shock: Secondary | ICD-10-CM

## 2018-04-18 LAB — POCT I-STAT 3, ART BLOOD GAS (G3+)
ACID-BASE EXCESS: 9 mmol/L — AB (ref 0.0–2.0)
BICARBONATE: 31.8 mmol/L — AB (ref 20.0–28.0)
O2 Saturation: 100 %
PO2 ART: 318 mmHg — AB (ref 83.0–108.0)
Patient temperature: 97.7
TCO2: 33 mmol/L — ABNORMAL HIGH (ref 22–32)
pCO2 arterial: 36.4 mmHg (ref 32.0–48.0)
pH, Arterial: 7.548 — ABNORMAL HIGH (ref 7.350–7.450)

## 2018-04-18 LAB — BASIC METABOLIC PANEL
Anion gap: 13 (ref 5–15)
BUN: 15 mg/dL (ref 8–23)
CHLORIDE: 99 mmol/L (ref 98–111)
CO2: 28 mmol/L (ref 22–32)
CREATININE: 1.02 mg/dL (ref 0.61–1.24)
Calcium: 11.1 mg/dL — ABNORMAL HIGH (ref 8.9–10.3)
GFR calc Af Amer: >60 mL/min (ref >60)
GFR calc non Af Amer: >60 mL/min (ref >60)
GLUCOSE: 103 mg/dL — AB (ref 70–99)
POTASSIUM: 3.4 mmol/L — AB (ref 3.5–5.1)
Sodium: 140 mmol/L (ref 135–145)

## 2018-04-18 LAB — CBC WITH DIFFERENTIAL/PLATELET
Abs Immature Granulocytes: 0 10*3/uL (ref 0.0–0.1)
Basophils Absolute: 0 10*3/uL (ref 0.0–0.1)
Basophils Relative: 0 %
EOS PCT: 0 %
Eosinophils Absolute: 0 10*3/uL (ref 0.0–0.7)
HEMATOCRIT: 37.4 % — AB (ref 39.0–52.0)
HEMOGLOBIN: 12 g/dL — AB (ref 13.0–17.0)
Immature Granulocytes: 0 %
LYMPHS ABS: 0.7 10*3/uL (ref 0.7–4.0)
LYMPHS PCT: 8 %
MCH: 31 pg (ref 26.0–34.0)
MCHC: 32.1 g/dL (ref 30.0–36.0)
MCV: 96.6 fL (ref 78.0–100.0)
MONO ABS: 0.5 10*3/uL (ref 0.1–1.0)
MONOS PCT: 6 %
Neutro Abs: 7.2 10*3/uL (ref 1.7–7.7)
Neutrophils Relative %: 86 %
Platelets: 115 10*3/uL — ABNORMAL LOW (ref 150–400)
RBC: 3.87 MIL/uL — AB (ref 4.22–5.81)
RDW: 17.5 % — ABNORMAL HIGH (ref 11.5–15.5)
WBC: 8.4 10*3/uL (ref 4.0–10.5)

## 2018-04-18 LAB — GLUCOSE, CAPILLARY
Glucose-Capillary: 98 mg/dL (ref 70–99)
Glucose-Capillary: 112 mg/dL — ABNORMAL HIGH (ref 70–99)

## 2018-04-18 LAB — MRSA PCR SCREENING: MRSA by PCR: NEGATIVE

## 2018-04-18 LAB — MAGNESIUM: Magnesium: 1.7 mg/dL (ref 1.7–2.4)

## 2018-04-18 MED ORDER — METOPROLOL TARTRATE 50 MG PO TABS
50.0000 mg | ORAL_TABLET | Freq: Two times a day (BID) | ORAL | Status: DC
  Filled 2018-04-18: qty 1

## 2018-04-18 MED ORDER — VANCOMYCIN HCL IN DEXTROSE 1-5 GM/200ML-% IV SOLN: 1000.0000 mg | Freq: Two times a day (BID) | INTRAVENOUS | Status: DC

## 2018-04-18 MED ORDER — JEVITY 1.2 CAL PO LIQD
1000.0000 mL | ORAL | Status: DC
  Filled 2018-04-18 (×2): qty 1000

## 2018-04-18 MED ORDER — ENOXAPARIN SODIUM 60 MG/0.6ML ~~LOC~~ SOLN
60.0000 mg | Freq: Two times a day (BID) | SUBCUTANEOUS | Status: DC
  Administered 2018-04-18: 60 mg via SUBCUTANEOUS
  Filled 2018-04-18 (×2): qty 0.6

## 2018-04-18 MED ORDER — SODIUM CHLORIDE 0.9 % IV SOLN
1.0000 g | Freq: Once | INTRAVENOUS | Status: DC
  Filled 2018-04-18: qty 10

## 2018-04-18 MED ORDER — ORAL CARE MOUTH RINSE
15.0000 mL | Freq: Two times a day (BID) | OROMUCOSAL | Status: DC
  Administered 2018-04-18 – 2018-04-19 (×3): 15 mL via OROMUCOSAL

## 2018-04-18 MED ORDER — VANCOMYCIN HCL 10 G IV SOLR
1500.0000 mg | Freq: Once | INTRAVENOUS | Status: AC
  Administered 2018-04-18: 1500 mg via INTRAVENOUS
  Filled 2018-04-18: qty 1500

## 2018-04-18 MED ORDER — FENTANYL CITRATE (PF) 100 MCG/2ML IJ SOLN: 50.0000 ug | Freq: Once | INTRAMUSCULAR | Status: DC

## 2018-04-18 MED ORDER — PIPERACILLIN-TAZOBACTAM 3.375 G IVPB
3.3750 g | Freq: Three times a day (TID) | INTRAVENOUS | Status: DC
  Administered 2018-04-18 – 2018-04-19 (×4): 3.375 g via INTRAVENOUS
  Filled 2018-04-18 (×6): qty 50

## 2018-04-18 MED ORDER — FENTANYL 2500MCG IN NS 250ML (10MCG/ML) PREMIX INFUSION
0.0000 ug/h | INTRAVENOUS | Status: DC
  Administered 2018-04-18: 50 ug/h via INTRAVENOUS
  Administered 2018-04-19: 250 ug/h via INTRAVENOUS
  Administered 2018-04-19: 350 ug/h via INTRAVENOUS
  Filled 2018-04-18 (×3): qty 250

## 2018-04-18 MED ORDER — FENTANYL CITRATE (PF) 100 MCG/2ML IJ SOLN
INTRAMUSCULAR | Status: AC
  Filled 2018-04-18: qty 2

## 2018-04-18 MED ORDER — SODIUM CHLORIDE 0.9 % IV SOLN
500.0000 mg | Freq: Once | INTRAVENOUS | Status: DC
  Filled 2018-04-18: qty 500

## 2018-04-18 MED ORDER — DOPAMINE-DEXTROSE 3.2-5 MG/ML-% IV SOLN
0.0000 ug/kg/min | INTRAVENOUS | Status: DC
  Administered 2018-04-18: 5 ug/kg/min via INTRAVENOUS

## 2018-04-18 MED ORDER — FUROSEMIDE 40 MG PO TABS
40.0000 mg | ORAL_TABLET | Freq: Every day | ORAL | Status: DC
  Filled 2018-04-18: qty 1

## 2018-04-18 MED ORDER — SODIUM CHLORIDE 0.9 % IV SOLN
INTRAVENOUS | Status: DC | PRN
  Administered 2018-04-18: 1000 mL via INTRAVENOUS

## 2018-04-18 MED ORDER — SODIUM CHLORIDE 0.9% FLUSH
10.0000 mL | Freq: Two times a day (BID) | INTRAVENOUS | Status: DC
  Administered 2018-04-18 – 2018-04-19 (×2): 10 mL

## 2018-04-18 MED ORDER — FAMOTIDINE IN NACL 20-0.9 MG/50ML-% IV SOLN
20.0000 mg | Freq: Two times a day (BID) | INTRAVENOUS | Status: DC
  Administered 2018-04-18: 20 mg via INTRAVENOUS
  Filled 2018-04-18: qty 50

## 2018-04-18 MED ORDER — SODIUM CHLORIDE 0.9% FLUSH: 10.0000 mL | INTRAVENOUS | Status: DC | PRN

## 2018-04-18 MED ORDER — FENTANYL CITRATE (PF) 100 MCG/2ML IJ SOLN
INTRAMUSCULAR | Status: AC
  Administered 2018-04-18: 50 ug
  Filled 2018-04-18: qty 2

## 2018-04-18 MED ORDER — POTASSIUM CHLORIDE 20 MEQ/15ML (10%) PO SOLN
40.0000 meq | Freq: Once | ORAL | Status: AC
  Administered 2018-04-19: 40 meq
  Filled 2018-04-18: qty 30

## 2018-04-18 MED ORDER — FENTANYL CITRATE (PF) 100 MCG/2ML IJ SOLN
25.0000 ug | INTRAMUSCULAR | Status: DC | PRN
  Administered 2018-04-18 (×3): 50 ug via INTRAVENOUS
  Filled 2018-04-18 (×2): qty 2

## 2018-04-18 MED ORDER — PIPERACILLIN-TAZOBACTAM 3.375 G IVPB
3.3750 g | Freq: Three times a day (TID) | INTRAVENOUS | Status: DC
  Filled 2018-04-18: qty 50

## 2018-04-18 MED ORDER — ENOXAPARIN SODIUM 60 MG/0.6ML ~~LOC~~ SOLN
60.0000 mg | SUBCUTANEOUS | Status: DC
  Filled 2018-04-18: qty 0.6

## 2018-04-18 MED ORDER — EPINEPHRINE PF 1 MG/ML IJ SOLN
0.5000 ug/min | INTRAVENOUS | Status: DC
  Filled 2018-04-18: qty 4

## 2018-04-18 MED ORDER — CHLORHEXIDINE GLUCONATE 0.12 % MT SOLN
15.0000 mL | Freq: Two times a day (BID) | OROMUCOSAL | Status: DC
  Administered 2018-04-18 – 2018-04-19 (×2): 15 mL via OROMUCOSAL
  Filled 2018-04-18: qty 15

## 2018-04-18 MED ORDER — LIDOCAINE IN D5W 4-5 MG/ML-% IV SOLN
1.0000 mg/min | INTRAVENOUS | Status: DC
  Administered 2018-04-18: 2 mg/min via INTRAVENOUS

## 2018-04-18 MED ORDER — VANCOMYCIN HCL IN DEXTROSE 1-5 GM/200ML-% IV SOLN
1000.0000 mg | Freq: Two times a day (BID) | INTRAVENOUS | Status: DC
  Administered 2018-04-19: 1000 mg via INTRAVENOUS
  Filled 2018-04-18 (×2): qty 200

## 2018-04-18 MED ORDER — VANCOMYCIN HCL 10 G IV SOLR
1500.0000 mg | Freq: Once | INTRAVENOUS | Status: DC
  Filled 2018-04-18: qty 1500

## 2018-04-18 MED ORDER — CHLORHEXIDINE GLUCONATE CLOTH 2 % EX PADS: 6.0000 | MEDICATED_PAD | Freq: Every day | CUTANEOUS | Status: DC

## 2018-04-18 NOTE — Progress Notes (Signed)
Assisted with CPR and intubation of patient. And transport to patient's  ICU room.

## 2018-04-18 NOTE — Progress Notes (Addendum)
   Cardiology was called to patient code. Pt was on 2W when he was noted to be in [place] and code was called. He has been transferred to ICU and is now intubated.   Upon reviewed of the telemetry the patient was in afib with RVR until he became bradycardic  Around 1410 and deteriorated to a 12 second pause followed by Afib in the 60-70's and then back up to the 90's with frequent PVC's. At 1434 polymorphic VT began. Chest compressions were done and was shocked en route to ICU.   His amiodarone was stopped and IV lidocaine initiated.   In ICU he was in a-flutter with PVCs in the 70's-100. EKG post event shows atrial flutter with about 4:1 conduction. QTC 525.   Currently his rhythm has calmed down and is regular in the 50's-60's with only rare PVCs. BP stable.   Dr. Curt Bears, [place],  stopped by and agrees with the current therapy.   Dr. Sallyanne Kuster will attest this note with further evaluation and  recommendations.   Daune Perch, AGNP-C [facility] 04/18/2018  3:40 PM Pager: [phone]  I have seen and examined the patient along with Daune Perch, AGNP-C.  I have reviewed the chart, notes and new data.  I agree with NP's note.  Key new complaints: intubated, responds to intense mechanical stimulation Key examination changes: irregular rhythm, nonfocal neuro exam Key new findings / data: telemetry findings as reviewed above. On current ECG, QTC 523 ms (with RBBB - broad QRS).  PLAN: Abrupt onset of bradycardia/pause on aof AFib w RVR suggests a possible respiratory/hypoxia event (aspiration?). This may have set the scenario for subsequent polymorphic VT and VF (through relative bradycardia and frequent PVCs leading to torsades de pointes in the setting of long QT from mild hypokalemia and amiodarone effect). Stopped amiodarone and will use IV lidocaine overnight - wean off in AM if no recurrent ventricular arrhythmia. Continue beta blockers. Prognosis is poor in this elderly man with  cavitary lung lesion (suspected neoplasm). Note family decision for DNR status. This is appropriate.  Sanda Klein, MD, [place] [phone] 04/18/2018, 5:21 PM

## 2018-04-18 NOTE — Progress Notes (Signed)
Cortrak Tube Team Note:  Consult received to place a Cortrak feeding tube.   A 10 F Cortrak tube was placed in the LEFT nare and secured with a nasal bridle at 89 cm. Per the Cortrak monitor reading the tube tip is post-pyloric.   No x-ray is required. RN may begin using tube.   If the tube becomes dislodged please keep the tube and contact the Cortrak team at [email or web address] ([email or web address]) for replacement.  If after hours and replacement cannot be delayed, place a NG tube and confirm placement with an abdominal x-ray.   Kerman Passey MS, RD, [place], [place] [phone] Pager  [phone] Weekend/On-Call Pager

## 2018-04-18 NOTE — Progress Notes (Signed)
I received a request from a colleague Chaplain to meet the family of the patient, since the patient was recently moved to the Heart Unit I serve. I met with the patient's daughter and sister-in-law in the waiting room. We went to visit the patient together. The patient was non-responsive to our presence. I offered spiritual support to the family members by reading scripture and leading in prayer. I shared that the Chaplain is available for additional support as needed or requested.    04/18/18 1646  Clinical Encounter Type  Visited With Patient and family together  Visit Type Spiritual support  Referral From Nurse  Consult/Referral To Chaplain  Spiritual Encounters  Spiritual Needs Prayer;Emotional  Stress Factors  Patient Stress Factors None identified  Family Stress Factors Exhausted    Chaplain Dr Redgie Grayer

## 2018-04-18 NOTE — NC FL2 (Signed)
[place] LEVEL OF CARE SCREENING TOOL     IDENTIFICATION  Patient Name: Derrick Ayala Birthdate: 09-19-1934 Sex: male Admission Date (Current Location): 04/22/2018  [facility] and [place] Number:  Herbalist and Address:  [facility] [place], [place] [address], [place], [place] [address]      Provider Number: [id number]  Attending Physician Name and Address:  [name], MD  Relative Name and Phone Number:       Current Level of Care: Hospital Recommended Level of Care: [place] Prior Approval Number:    Date Approved/Denied:   PASRR Number: [id number]  Discharge Plan: SNF    Current Diagnoses: Patient Active Problem List   Diagnosis Date Noted  . SOB (shortness of breath) 04/28/2018  . Postobstructive pneumonia 04/29/2018  . Hyperglycemia 04/18/2018  . New onset atrial fibrillation ([place]) 01/18/2018  . RBBB 01/18/2018  . Essential hypertension 11/01/2013  . Hypokalemia 10/22/2013  . 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    Orientation RESPIRATION BLADDER Height & Weight     Self, Place  Normal External catheter, Incontinent(Placed 9/16) Weight: 140 lb (63.5 kg) Height:  5\' 11"  (180.3 cm)  BEHAVIORAL SYMPTOMS/MOOD NEUROLOGICAL BOWEL NUTRITION STATUS      Incontinent Diet, Feeding tube(NPO at present, pending Cortrak placement)  AMBULATORY STATUS COMMUNICATION OF NEEDS Skin   Extensive Assist Verbally Normal                       Personal Care Assistance Level of Assistance  Feeding, Dressing, Bathing Bathing Assistance: Maximum assistance Feeding assistance: Independent Dressing Assistance: Maximum assistance     Functional Limitations Info  Sight, Hearing, Speech Sight Info: Adequate Hearing Info: Adequate Speech Info: Adequate    SPECIAL CARE FACTORS FREQUENCY  PT (By licensed PT), OT (By licensed OT)     PT Frequency: 3x OT Frequency: 3x            Contractures Contractures Info: Not  present    Additional Factors Info  Code Status, Allergies Code Status Info: Full Code Allergies Info: NO known allergies           Current Medications (04/18/2018):  This is the current hospital active medication list Current Facility-Administered Medications  Medication Dose Route Frequency Provider Last Rate Last Dose  . albuterol (PROVENTIL) (2.5 MG/3ML) 0.083% nebulizer solution 2.5 mg  2.5 mg Nebulization TID Lavina Hamman, MD   2.5 mg at 04/18/18 0713  . amiodarone (NEXTERONE PREMIX) 360-4.14 MG/200ML-% (1.8 mg/mL) IV infusion  30 mg/hr Intravenous Continuous Croitoru, Mihai, MD 16.67 mL/hr at 04/18/18 1100 30 mg/hr at 04/18/18 1100  . azithromycin (ZITHROMAX) 500 mg in sodium chloride 0.9 % 250 mL IVPB  500 mg Intravenous Once [name], MD      . cefTRIAXone (ROCEPHIN) 1 g in sodium chloride 0.9 % 100 mL IVPB  1 g Intravenous Once [name], MD      . enoxaparin (LOVENOX) injection 60 mg  60 mg Subcutaneous Q12H [name], MD      . enoxaparin (LOVENOX) injection 60 mg  60 mg Subcutaneous NOW [name], MD      . furosemide (LASIX) tablet 40 mg  40 mg Oral Daily Croitoru, Mihai, MD      . LORazepam (ATIVAN) injection 0.5 mg  0.5 mg Intravenous Q4H PRN Lavina Hamman, MD   0.5 mg at 04/18/18 1324  . metoprolol tartrate (LOPRESSOR) injection 2.5 mg  2.5 mg Intravenous Q4H PRN  Lavina Hamman, MD   2.5 mg at 04/16/18 0930  . metoprolol tartrate (LOPRESSOR) tablet 50 mg  50 mg Oral BID Croitoru, Mihai, MD      . potassium chloride 20 MEQ/15ML (10%) solution 40 mEq  40 mEq Per Tube Once [name], MD      . sodium chloride (OCEAN) 0.65 % nasal spray 2 spray  2 spray Each Nare Daily Karmen Bongo, MD   2 spray at 04/18/18 0825     Discharge Medications: Please see discharge summary for a list of discharge medications.  Relevant Imaging Results:  Relevant Lab Results:   Additional Information SSN: [id number]  Eileen Stanford, LCSW

## 2018-04-18 NOTE — Progress Notes (Signed)
Pharmacy Antibiotic Note  Derrick Ayala is a 83 y.o. male who was being treated for CAP prior to code blue called on 9/18 for VT arrest. During the code, the patient had a central line placed by CCM. Pharmacy has been consulted for vancomycin and Zosyn dosing for empiric coverage.  Plan: Vancomycin 1,500 mg IV x 1 followed by vancomycin 1,000 mg IV every 12 hours.  Goal trough 15-20 mcg/mL. Zosyn 3.375g IV q8h (4 hour infusion). Follow up cultures, LOT, and vancomycin trough PRN.  Height: 5\' 11"  (180.3 cm) Weight: 168 lb 6.9 oz (76.4 kg) IBW/kg (Calculated) : 75.3  Temp (24hrs), Avg:97.2 F (36.2 C), Min:96.8 F (36 C), Max:97.5 F (36.4 C)  Recent Labs  Lab 04/17/2018 1441 04/13/18 0300  04/14/18 0405 04/16/18 0837 04/17/18 0228 04/17/18 1926 04/18/18 0245  WBC  --  6.2  --  5.8 6.5 7.4  --  8.4  CREATININE  --  0.97   < > 0.96 1.10 1.09 1.11 1.02  LATICACIDVEN 2.03*  --   --   --   --   --   --   --    < > = values in this interval not displayed.    Estimated Creatinine Clearance: 60.5 mL/min (by C-G formula based on SCr of 1.02 mg/dL).    No Known Allergies  Antimicrobials this admission: Azithromycin 9/12 >> 9/17 Ceftriaxone 9/12 >> 9/17 Zosyn x 1 9/12  Dose adjustments this admission: none  Microbiology results: 9/12 BCx: ngF 9/16 UCx: ngF 9/18 Sputum: normal flora   Thank you for allowing pharmacy to be a part of this patient's care.  Vertis Kelch, PharmD PGY1 Pharmacy Resident Phone [phone] 04/18/2018       3:43 PM

## 2018-04-18 NOTE — Progress Notes (Signed)
   04/18/18 1600  Clinical Encounter Type  Visited With Patient;Family;Health care provider;Other (Comment) (code blue team)  Visit Type Initial;Code;Psychological support;Spiritual support  Referral From Other (Comment) (code blue )  Stress Factors  Patient Stress Factors Major life changes;Other (Comment) (wife had stroke, reported to be in SNF)  Family Stress Factors Major life changes;Other (Comment) (wife had stroke, reported to be in SNF)   Responded to code blue in 2W03 at 2:39pm.    No family present at that time, was present to support staff as they worked.  Let unit secretary know I was present for when family arrived (she had already notified them).  Moved w/ code team when pt was transferred to [facility], remained present w/ code team while they continued to work on pt.  Let Network engineer on [place] know I was the chaplain present and that the family had been notified and was expected.    Upon arrival of family, met w/ pt's daughter and her mother in consult rm w/ dr and hospitalist.  Provided support, advocated for family in located pt's belongs so daughter could get contact details from pt's phone to contact pt's wife's cousin (this relative is the one maintaining primary contact and coordination w/ the pt's wife as she is in a facility for PT as reported by pt's daughter after being in [facility] last week for a stroke which pt's daughter reported as significantly affected pt's wife's ability to think/understand).  Communicated bk and forth w/ Dr and RN and the family before they were allowed bk to see the pt.  Contacted chaplain resident Redgie Grayer, who is the assigned chaplain for 2H so family would have the connection.  Redgie Grayer took the family back to see the pt after the RN came out to introduce himself and update them.  At that time, this chaplain signed off.  Myra Gianotti resident, [phone]

## 2018-04-18 NOTE — Procedures (Signed)
CPR Procedure Note  Bradycardic arrest to VT/VF, epi/atropine and shocks delivered.  See code sheet for details  Rush Farmer, M.D. [facility] Pulmonary/Critical Care Medicine. Pager: [phone]. After hours pager: [phone].

## 2018-04-18 NOTE — Code Documentation (Signed)
Resident code team was called to pt room. CCM is running code.    Clemetine Marker, PGY-1

## 2018-04-18 NOTE — Progress Notes (Signed)
  Speech Language Pathology Treatment: Dysphagia  Patient Details Name: Derrick Ayala MRN: [id number] DOB: [birth year] Today's Date: 04/18/2018 Time: 0940-7680 SLP Time Calculation (min) (ACUTE ONLY): 28 min  Assessment / Plan / Recommendation Clinical Impression  Treatment focused on diagnostic PO trials, therapeutic exercise, and education with pt/daughter. Oral care was performed with white coating on tongue and hard palate. Most of the coating on the hard palate could be cleared, but the tongue had minimal improvement. Cognitively, pt needed Max cues for sustained attention to PO trials, effortful swallows. He continues to have a wet vocal quality signs concerning for aspiration. His daughter arrived during session and was updated about MBS results. She denies any h/o PNA and any known h/o dysphagia. I did share my concern about a more chronic issue, for which he may have previously been compensating. She is concerned at this point about his nutrition, and wants him to have some source of nutritional intake. Given that it has been several days and the severity of his dysphagia, may wish to consider temporary alternative means of nutrition. We did start to broach the subjective of longer-term decisions (accepting risk of aspiration to eat vs longer term alternative means), although they will likely benefit from palliative care consult as medical w/u continues to progress.   HPI HPI: Pt is an 83 yo male admitted on 04/03/2018 with SOB, found to have postobstructive pneumonia with a possible pulmonary mass. 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 fib, HTN. Pt has reported difficulty swallowing to providers in the past per chart review. He was ordered to have MBS in 2015 and again in June 2019, but he did not comt to either appointment.      SLP Plan  Continue with current plan of care       Recommendations  Diet recommendations: NPO;Other(comment)(can have a few ice chips after  oral care with staff) Medication Administration: Via alternative means                Oral Care Recommendations: Oral care QID Follow up Recommendations: Skilled Nursing facility SLP Visit Diagnosis: Dysphagia, oropharyngeal phase (R13.12);Dysphagia, pharyngoesophageal phase (R13.14) Plan: Continue with current plan of care       GO                Germain Osgood 04/18/2018, 11:38 AM  Germain Osgood, M.A. [place] Acute Environmental education officer [phone] Office [phone]

## 2018-04-18 NOTE — Progress Notes (Signed)
PMT progress note  Overhead Code Blue noted. Discussed with PCCM Colleague Dr Nelda Marseille about the patient's code resuscitation, that he is intubated, his daughter Derrick Ayala arrived at the bedside and additional goals of care discussions were held by [place], code team.   Patient is now seen in the ICU, intubated, code documentation reviewed. Agree with establishing limited code, no CPR/no cardioversion.   BP 108/69   Pulse (!) 45   Temp 97.7 F (36.5 C) (Oral)   Resp 18   Ht _0  (1.803 m)   Wt 76.4 kg   SpO2 100%   BMI 23.49 kg/m    I met with daughter Derrick Ayala outside in the waiting area, a family friend was also present, we were later joined by hospital chaplain as well.   Daughter Derrick Ayala is appreciative of all the information she has received about the patient's condition, especially from critical care. She is tearful and shook up but is trying to hold herself together. She states that the patient's wife recently had a stroke and is simply not able to comprehend or verbalize meaningfully at this point. She talks about being overwhelmed with all the care giving she has had to provide for both the patient as well as his wife recently.  The patient's daughter describes him as a very independent person who likes to make his own decisions. He had been complaining of gradual and progressive weakness over the course of the past few months. He started having shortness of breath since the past week. The family takes an annual vacation in [place] but they were not able to do it this year because of the patient's weakness.  We discussed about the patient's current condition, he is status post a cardio pulmonary resuscitation event. He is currently intubated and mechanically ventilated. We discussed about multiple serious underlying conditions already at play including atrial fibrillation with rapid ventricular response, pneumonia, lung mass, high concern for malignancy, long QT interval, patient's  recent swallowing difficulty. Additional goals of care discussions undertaken. For now, time trial of current therapies and no CODE BLUE, no CPR and no cardioversion.  I did gently bring up establishing a mode of care that focuses exclusively on comfort should the patient not demonstrate any signs of meaningful/significant recovery or stabilization in the near future. Daughter is tearful but understood the nature of our conversation and stated that she did not wish to prolong the inevitable. Offered support presence and voiced understanding. Appreciate chaplain being present.   PMT to continue to follow  35 minutes spent.  Time in: 3: 45 PM  Time out 4: 20 PM

## 2018-04-18 NOTE — Progress Notes (Signed)
PULMONARY / CRITICAL CARE MEDICINE   NAME:  Derrick Ayala, MRN:  [id number], DOB:  October 22, 1934, LOS: 6 ADMISSION DATE:  04/18/2018, CONSULTATION DATE:  04/13/2018 REFERRING MD:  Dr. Posey Pronto, [facility] CHIEF COMPLAINT:  Cough  BRIEF HISTORY:    83 yo male former smoker presented with progressive cough with sputum.  Found to have mass like consolidation on CT chest.  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  HISTORY OF PRESENT ILLNESS   83 yo male has cough with sputum for past 6 months.  This has been getting worse.  Was bringing up green sputum with some blood streaking.  Not having fever, or chest pain.  Was seen by PCP.  Had CXR and then CT chest, and advised he needed treatment for pneumonia.  Started on ABx.  Patient had what appears to be an aspiration event and suffered a cardiac arrest.  Palliative care had seen the patient but family wished for all resuscitative measures.  See code sheet for details.    SIGNIFICANT PAST MEDICAL HISTORY   HTN, A fib, SCC Lt vocal cord s/p XRT  SIGNIFICANT EVENTS:  9/12 Admit  STUDIES:   CT chest 9/12 >> 5.4 cm subcarinal mass, b/l pleural calcifications, small b/l effusions, occluded LLL bronchus, 6.8 cm mass Lt infrahilar region, moderate centrilobular emphysema (reviewed by me)  CULTURES:  Blood 9/12 >> Sputum 9/12 >>   ANTIBIOTICS:  Rocephin 9/12 >> Zithromax 9/12 >>  LINES/TUBES:    CONSULTANTS:  Pulmonary 9/13  SUBJECTIVE:  Cardiac arrest  CONSTITUTIONAL: BP 131/87 (BP Location: Left Arm)   Pulse 76   Temp (!) 97.4 F (36.3 C) (Oral)   Resp 20   Ht 5\' 11"  (1.803 m)   Wt 76.4 kg   SpO2 95%   BMI 23.49 kg/m   I/O last 3 completed shifts: In: 76.7 [P.O.:10; I.V.:66.7] Out: 3100 [Urine:3100]        PHYSICAL EXAM:  General - Sedated and intubated, moving spontaneously  Eyes - PERRL, EOM-I ENT - Intact Cardiac - RRR, Nl S1/S2 and -M/R/G Chest - Coarse BS diffusely Abdomen - Soft, NT, ND and  +BS Extremities - 2+ edema Skin - Intact Lymphatics - No LAN Neuro - Unresponsive, moving all ext spontaneously  Psych - Unresponsive  I reviewed chest CT myself, masses noted  ASSESSMENT AND PLAN    83 year old male with a cardiac arrest likely due to respiratory event in a patient with history of laryngeal cancer and now a lung mass.    Cardiac arrest:   - Per cards  - Tele monitoring  - Now LCB with no CPR/cardioversion  - No hypothermia given cancer, anticoagulation, immediate response to code and sepsis  Cardiogenic/septic shock:  - Vanc/zosyn  - Pan culture  - F/u on culture  - Levophed drip for BP support  - Line inserted emergently during the code, if patient does not arrest further will need the line changed in AM  Anoxic brain injury  - If hemodynamically more stable by AM will likely need a head CT and EEG  GOC:  - Spoke with the daughter at length, after a long discussion decision was made to change code status to LCB with no CPR/cardioversion and if survives then will need further discussion regarding GOC  Lung Mass:  - Hold off bronch for now  PCCM will assume care  SUMMARY OF TODAY'S PLAN:  Finish abx treatment Need family to discuss plan of care Palliative care  consult called  Best Practice / Goals of Care / Disposition.   DVT PROPHYLAXIS: SCDs SUP: Not indicated NUTRITION: soft diet MOBILITY: bed rest GOALS OF CARE: full code FAMILY DISCUSSIONS: no family at bedside  D/w Dr. Posey Pronto  LABS  Glucose No results for input(s): GLUCAP in the last 168 hours.  BMET Recent Labs  Lab 04/17/18 0228 04/17/18 1926 04/18/18 0245  NA 141 142 140  K 3.0* 3.7 3.4*  CL 101 100 99  CO2 28 28 28   BUN 14 14 15   CREATININE 1.09 1.11 1.02  GLUCOSE 95 110* 103*    Liver Enzymes Recent Labs  Lab 04/10/2018 1137 04/14/18 0405 04/16/18 0837  AST 23 18 28   ALT 15 14 18   ALKPHOS 66 71 71  BILITOT 1.5* 1.7* 1.5*  ALBUMIN 2.7* 2.7* 2.8*     Electrolytes Recent Labs  Lab 04/17/18 0228 04/17/18 1926 04/18/18 0245  CALCIUM 10.9* 11.1* 11.1*  MG 1.9 1.7 1.7    CBC Recent Labs  Lab 04/16/18 0837 04/17/18 0228 04/18/18 0245  WBC 6.5 7.4 8.4  HGB 11.3* 11.7* 12.0*  HCT 35.1* 36.2* 37.4*  PLT 94* 141* 115*    ABG Recent Labs  Lab 04/16/18 0242  PHART 7.427  PCO2ART 36.0  PO2ART 129*    Coag's No results for input(s): APTT, INR in the last 168 hours.  Sepsis Markers Recent Labs  Lab 04/15/2018 1441  LATICACIDVEN 2.03*    Cardiac Enzymes Recent Labs  Lab 04/16/18 0837  TROPONINI 0.03*   The patient is critically ill with multiple organ systems failure and requires high complexity decision making for assessment and support, frequent evaluation and titration of therapies, application of advanced monitoring technologies and extensive interpretation of multiple databases.   Critical Care Time devoted to patient care services described in this note is  95  Minutes. This time reflects time of care of this signee Dr [name]. This critical care time does not reflect procedure time, or teaching time or supervisory time of PA/NP/Med student/Med Resident etc but could involve care discussion time.  Rush Farmer, M.D. [facility] Pulmonary/Critical Care Medicine. Pager: [phone]. After hours pager: [phone].  04/18/2018, 3:24 PM

## 2018-04-18 NOTE — Progress Notes (Signed)
Arrived to Code Blue. Patient has 2 PIV and both working at this time per code team. Fran Lowes, RN VAST

## 2018-04-18 NOTE — Plan of Care (Signed)
?  Problem: Clinical Measurements: ?Goal: Will remain free from infection ?Outcome: Progressing ?Goal: Diagnostic test results will improve ?Outcome: Progressing ?Goal: Respiratory complications will improve ?Outcome: Progressing ?  ?

## 2018-04-18 NOTE — Progress Notes (Signed)
[facility] Progress Note  Patient: Derrick Ayala DHR:[id number]   PCP: Lujean Amel, MD DOB: 1935/04/17   DOA: 04/30/2018   DOS: 04/18/2018   Date of Service: the patient was seen and examined on 04/18/2018  Subjective: remains in restraints, less agitated this morning  Brief hospital course: Pt. 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 fib, HTN; admitted on 04/26/2018, presented with complaint of shortness of breath, was found to have postobstructive pneumonia with pulmonary mass.  Pulmonary was consulted and patient was hopefully going for bronchoscopy once his pneumonia settles.  Over the weekend patient went A. fib with RVR as well as had acute encephalopathy.  Further work-up showed acute systolic CHF.  Cardiology consulted. Currently further plan is further work-up for the pulmonary mass but before that stabilization and treatment of the pneumonia and CHF.  Assessment and Plan:  1.  Postobstructive versus aspiration pneumonia -clinically improving on a regimen of IV ceftriaxone and azithromycin -Cultures are negative -Unfortunately aspiration continues to be a continued concern due to worsening dysphagia, aspiration precautions, nothing by mouth for now -Stop antibiotics after today's dose -Continue pulmonary toilet, flutter Valve, Mucinex as needed  2. Necrotic lung mass and subcarinal lung mass -patient has had a prior history of squamous cell carcinoma with vocal cord, could be primary lung cancer,, less likely metastatic disease -with postobstructive pneumonia, completing antibiotic course -pulmonary consulted, Eliquis held, he was not felt to be stable enough from Cards perspective for a bronchoscopy at this time, plan to reconsult pulmonary, as and when this improves -Overall prognosis is very poor due to advanced age, multiple comorbidities namely worsening heart failure dysphagia and suspected lung cancer  3. Severe dysphagia -multifactorial  from prior laryngeal CA, XRT, now worse in the setting of overall debility from pneumonia, presumed lung cancer -Discussed with the patient about this this morning, he is more alert, wants to avoid PEG tube, will place cortrak temporarily and start tube feeds -Palliative care meeting for goals of care requested due to overall very poor prognosis  4.  A. Fib with RVR -continue IV Lopressor, transition to oral Toprol after cortrak placed -CHA?DS?-VASc > 3 -at baseline on chronic anticoagulation with Eliquis, Currently on hold. -continue Lovenox, change to full dose   -on IV amio per Cards  4. Metabolic encephalopathy -Due to delirium from a long hospitalization most likely -CT head unremarkable, ammonia LFT is benign -Supportive care, mentation is improving, more appropriate this morning -unable to use antipsychotics for agitation due to prolonged QTC, use low-dose Ativan as needed with high threshold  5.  Acute systolic CHF, on chronic diastolic CHF. - Echo performed which shows 35% EF which is acute drop.  With diffuse hypokinesis. Cardiology consulted, currently differential includes tachycardia induced cardiomyopathy as versus CAD. Management the more conservative with rate control and volume management.  No ischemic work-up for now, recommend outpatient repeat echocardiogram in 3 to 6 months. -Continue IV Lasix and beta blocker  6.  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  7.  BPH. Continue Flomax.  8.  Moderate Protein calorie malnutrition. -start tube feeds via core track  9. Hypokalemia. Hypomagnesemia Replaced  Diet: N.p.o. DVT Prophylaxis: subcutaneous Heparin  Advance goals of care discussion: Full code  Family Communication: no family at bedside, will attempt to contact Daughter  Disposition:  Discharge to be determined .  Consultants: PCCM , cardiology, palliative care Procedures: Echocardiogram    Antibiotics: Anti-infectives (  From admission, onward)   Start     Dose/Rate Route Frequency Ordered Stop   04/18/18 1700  cefTRIAXone (ROCEPHIN) 1 g in sodium chloride 0.9 % 100 mL IVPB     1 g 200 mL/hr over 30 Minutes Intravenous  Once 04/18/18 1152     04/18/18 1600  azithromycin (ZITHROMAX) 500 mg in sodium chloride 0.9 % 250 mL IVPB     500 mg 250 mL/hr over 60 Minutes Intravenous  Once 04/18/18 1152     04/16/18 1500  cefTRIAXone (ROCEPHIN) 1 g in sodium chloride 0.9 % 100 mL IVPB     1 g 200 mL/hr over 30 Minutes Intravenous Every 24 hours 04/16/18 1356 04/17/18 1810   04/16/18 1500  azithromycin (ZITHROMAX) 500 mg in sodium chloride 0.9 % 250 mL IVPB     500 mg 250 mL/hr over 60 Minutes Intravenous Every 24 hours 04/16/18 1356 04/17/18 1722   04/16/18 1000  cefdinir (OMNICEF) capsule 300 mg  Status:  Discontinued     300 mg Oral Every 12 hours 04/16/18 0834 04/16/18 1356   04/15/18 1800  azithromycin (ZITHROMAX) tablet 500 mg  Status:  Discontinued     500 mg Oral Daily-1800 04/15/18 1322 04/16/18 1356   04/02/2018 2000  cefTRIAXone (ROCEPHIN) 1 g in sodium chloride 0.9 % 100 mL IVPB  Status:  Discontinued     1 g 200 mL/hr over 30 Minutes Intravenous Every 24 hours 04/04/2018 1550 04/16/18 0834   04/01/2018 1800  azithromycin (ZITHROMAX) 500 mg in sodium chloride 0.9 % 250 mL IVPB  Status:  Discontinued     500 mg 250 mL/hr over 60 Minutes Intravenous Every 24 hours 04/11/2018 1550 04/15/18 1322    1330  piperacillin-tazobactam (ZOSYN) IVPB 3.375 g     3.375 g 100 mL/hr over 30 Minutes Intravenous  Once 04/21/2018 1324 04/05/2018 1555       Objective: Physical Exam: Vitals:   04/18/18 0000 04/18/18 0625 04/18/18 0715 04/18/18 0728  BP:  (!) 145/97  (!) 136/102  Pulse:  82 87 76  Resp:   18 16  Temp:    (!) 97.4 F (36.3 C)  TempSrc:    Oral  SpO2: 95%  94% 95%  Weight:      Height:        Intake/Output Summary (Last 24 hours) at 04/18/2018 1228 Last data filed  at 04/18/2018 1100 Gross per 24 hour  Intake 242.65 ml  Output 1800 ml  Net -1557.35 ml   Filed Weights   04/29/2018 1121  Weight: 63.5 kg   Gen: awake alert, oriented to place and person HEENT: PERRLA, Neck supple, no JVD Lungs: fine Basilar crackles CVS: S1-S2/irregularly irregular Abd: soft, Non tender, non distended, BS present Extremities: No Cyanosis, Clubbing or edema Skin: no new rashes Neurologic: Grossly no focal neuro deficit. Bilaterally Equal motor strength  Data Reviewed: CBC: Recent Labs  Lab 04/11/2018 1137 04/13/18 0300 04/14/18 0405 04/16/18 0837 04/17/18 0228 04/18/18 0245  WBC 5.7 6.2 5.8 6.5 7.4 8.4  NEUTROABS 4.6 4.8 4.4 5.3  --  7.2  HGB 10.7* 10.1* 10.5* 11.3* 11.7* 12.0*  HCT 33.4* 30.8* 32.4* 35.1* 36.2* 37.4*  MCV 95.4 96.0 97.3 97.8 95.8 96.6  PLT 159 141* 134* 94* 141* 269*   Basic Metabolic Panel: Recent Labs  Lab 04/14/18 0405 04/16/18 0837 04/17/18 0228 04/17/18 1926 04/18/18 0245  NA 139 139 141 142 140  K 4.1 3.6 3.0* 3.7 3.4*  CL 103 102 101 100 99  CO2 29 22 28 28 28   GLUCOSE 109* 107* 95 110* 103*  BUN 8 13 14 14 15   CREATININE 0.96 1.10 1.09 1.11 1.02  CALCIUM 10.0 10.8* 10.9* 11.1* 11.1*  MG 1.8 1.6* 1.9 1.7 1.7    Liver Function Tests: Recent Labs  Lab 04/10/2018 1137 04/14/18 0405 04/16/18 0837  AST 23 18 28   ALT 15 14 18   ALKPHOS 66 71 71  BILITOT 1.5* 1.7* 1.5*  PROT 6.6 6.6 7.2  ALBUMIN 2.7* 2.7* 2.8*   No results for input(s): LIPASE, AMYLASE in the last 168 hours. Recent Labs  Lab 04/16/18 0836  AMMONIA 33   Coagulation Profile: No results for input(s): INR, PROTIME in the last 168 hours. Cardiac Enzymes: Recent Labs  Lab 04/16/18 0837  TROPONINI 0.03*   BNP (last 3 results) No results for input(s): PROBNP in the last 8760 hours. CBG: No results for input(s): GLUCAP in the last 168 hours. Studies: Dg Swallowing Func-speech Pathology  Result Date: 04/17/2018 Objective Swallowing Evaluation:  Type of Study: MBS-Modified Barium Swallow Study  Patient Details Name: Layman Gully MRN: [id number] Date of Birth: Aug 08, 1934 Today's Date: 04/17/2018 Time: SLP Start Time (ACUTE ONLY): 1206 -SLP Stop Time (ACUTE ONLY): 1229 SLP Time Calculation (min) (ACUTE ONLY): 23 min Past Medical History: Past Medical History: Diagnosis Date . Hypertension  . Hypokalemia  . New onset atrial fibrillation ([place]) 01/18/2018  startd on Eliquis recently . RBBB 01/18/2018 . 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 COLONOSCOPY WITH PROPOFOL N/A 10/19/2017  Procedure: COLONOSCOPY WITH PROPOFOL;  Surgeon: Laurence Spates, MD;  Location: [facility];  Service: Endoscopy;  Laterality: N/A; . ESOPHAGOGASTRODUODENOSCOPY (EGD) WITH PROPOFOL N/A 10/19/2017  Procedure: ESOPHAGOGASTRODUODENOSCOPY (EGD) WITH PROPOFOL;  Surgeon: Laurence Spates, MD;  Location: [facility];  Service: Endoscopy;  Laterality: N/A; . HERNIA REPAIR    12-13 yrs ago HPI: Pt is an 83 yo male admitted on 04/14/2018 with SOB, found to have postobstructive pneumonia with a possible pulmonary mass. 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 fib, HTN. Pt has reported difficulty swallowing to providers in the past per chart review. He was ordered to have MBS in 2015 and again in June 2019, but he did not comt to either appointment.  Subjective: alert but confused, difficulty providing history Assessment / Plan / Recommendation CHL IP CLINICAL IMPRESSIONS 04/17/2018 Clinical Impression Pt has a moderate-severe oropharyngeal and cervical esophageal, with chronic component suspected given h/o laryngeal cancer s/p XRT. His lingual manipulation for bolus cohesion and A/P transfer is limited, resulting in slow bolus movement, decreased cohesion, and lingual residue.  Pharyngeally he  has significantly impaired base of tongue retraction, pharyngeal constriction, hyolaryngeal movement, and UES relaxation. Pt therefore has reduced epiglottic deflection, airway protection, and pharyngeal clearance. Pt has consitent penetration with thin liquids that he appears to sense once they hit the true vocal folds, but his spontaneous cough is not effective at clearing the airway and cognitively he does not always cough to command. Aspiration did occur with thin liquids as barium could not be cleared. As consistencies become thicker they offer increased airway protection during the swallow; however, they also result in increasing amounts of residue that is penetrated then after the swallow. After only a few bites of puree, pt's pyriform sinuses and valleculae were filled with barium, which  could never be completely cleared despite attempted strategies, use of yankauer/attempts at expectoration, and liquid rinses. Pt is at a high risk for poor nutrition/hydration as well as aspiration. What is unclear is how this compared to his baseline. If there is some acute decompensation then there could be some potential for recovery; however, if this is near baseline as suspected from [facility], pt will likely benefit from [place] discussion regarding aspiration risk. Would maintain NPO status for now except for few ice chips, administered by RN only after oral care.  SLP Visit Diagnosis Dysphagia, oropharyngeal phase (R13.12);Dysphagia, pharyngoesophageal phase (R13.14) Attention and concentration deficit following -- Frontal lobe and executive function deficit following -- Impact on safety and function Severe aspiration risk;Risk for inadequate nutrition/hydration   CHL IP TREATMENT RECOMMENDATION 04/17/2018 Treatment Recommendations Therapy as outlined in treatment plan below   Prognosis 04/17/2018 Prognosis for Safe Diet Advancement Guarded Barriers to Reach Goals Cognitive deficits;Severity of deficits;Time post onset  Barriers/Prognosis Comment -- CHL IP DIET RECOMMENDATION 04/17/2018 SLP Diet Recommendations NPO;Ice chips PRN after oral care Liquid Administration via -- Medication Administration Via alternative means Compensations -- Postural Changes --   CHL IP OTHER RECOMMENDATIONS 04/17/2018 Recommended Consults -- Oral Care Recommendations Oral care QID Other Recommendations Have oral suction available   CHL IP FOLLOW UP RECOMMENDATIONS 04/17/2018 Follow up Recommendations (No Data)   CHL IP FREQUENCY AND DURATION 04/17/2018 Speech Therapy Frequency (ACUTE ONLY) min 2x/week Treatment Duration 2 weeks      CHL IP ORAL PHASE 04/17/2018 Oral Phase Impaired Oral - Pudding Teaspoon -- Oral - Pudding Cup -- Oral - Honey Teaspoon -- Oral - Honey Cup -- Oral - Nectar Teaspoon -- Oral - Nectar Cup Weak lingual manipulation;Reduced posterior propulsion;Lingual/palatal residue;Decreased bolus cohesion;Premature spillage Oral - Nectar Straw -- Oral - Thin Teaspoon Weak lingual manipulation;Reduced posterior propulsion;Lingual/palatal residue;Decreased bolus cohesion;Premature spillage Oral - Thin Cup Weak lingual manipulation;Reduced posterior propulsion;Lingual/palatal residue;Decreased bolus cohesion;Premature spillage Oral - Thin Straw -- Oral - Puree Weak lingual manipulation;Reduced posterior propulsion;Lingual/palatal residue;Decreased bolus cohesion Oral - Mech Soft -- Oral - Regular -- Oral - Multi-Consistency -- Oral - Pill -- Oral Phase - Comment --  CHL IP PHARYNGEAL PHASE 04/17/2018 Pharyngeal Phase Impaired Pharyngeal- Pudding Teaspoon -- Pharyngeal -- Pharyngeal- Pudding Cup -- Pharyngeal -- Pharyngeal- Honey Teaspoon -- Pharyngeal -- Pharyngeal- Honey Cup -- Pharyngeal -- Pharyngeal- Nectar Teaspoon -- Pharyngeal -- Pharyngeal- Nectar Cup Reduced pharyngeal peristalsis;Reduced epiglottic inversion;Reduced anterior laryngeal mobility;Reduced laryngeal elevation;Reduced airway/laryngeal closure;Reduced tongue base  retraction;Pharyngeal residue - valleculae;Pharyngeal residue - pyriform;Penetration/Aspiration during swallow;Penetration/Apiration after swallow Pharyngeal Material enters airway, remains ABOVE vocal cords and not ejected out Pharyngeal- Nectar Straw -- Pharyngeal -- Pharyngeal- Thin Teaspoon Reduced pharyngeal peristalsis;Reduced epiglottic inversion;Reduced anterior laryngeal mobility;Reduced laryngeal elevation;Reduced airway/laryngeal closure;Reduced tongue base retraction;Pharyngeal residue - valleculae;Pharyngeal residue - pyriform;Penetration/Aspiration during swallow Pharyngeal Material enters airway, remains ABOVE vocal cords and not ejected out Pharyngeal- Thin Cup Reduced pharyngeal peristalsis;Reduced epiglottic inversion;Reduced anterior laryngeal mobility;Reduced laryngeal elevation;Reduced airway/laryngeal closure;Reduced tongue base retraction;Pharyngeal residue - valleculae;Pharyngeal residue - pyriform;Penetration/Aspiration during swallow;Penetration/Apiration after swallow Pharyngeal Material enters airway, passes BELOW cords and not ejected out despite cough attempt by patient Pharyngeal- Thin Straw -- Pharyngeal -- Pharyngeal- Puree Reduced pharyngeal peristalsis;Reduced epiglottic inversion;Reduced anterior laryngeal mobility;Reduced laryngeal elevation;Reduced airway/laryngeal closure;Reduced tongue base retraction;Pharyngeal residue - valleculae;Pharyngeal residue - pyriform;Penetration/Apiration after swallow Pharyngeal Material enters airway, remains ABOVE vocal cords and not ejected out Pharyngeal- Mechanical Soft -- Pharyngeal -- Pharyngeal- Regular -- Pharyngeal -- Pharyngeal- Multi-consistency -- Pharyngeal -- Pharyngeal- Pill -- Pharyngeal --  Pharyngeal Comment --  CHL IP CERVICAL ESOPHAGEAL PHASE 04/17/2018 Cervical Esophageal Phase Impaired Pudding Teaspoon -- Pudding Cup -- Honey Teaspoon -- Honey Cup -- Nectar Teaspoon -- Nectar Cup Reduced cricopharyngeal relaxation Nectar Straw  -- Thin Teaspoon Reduced cricopharyngeal relaxation Thin Cup Reduced cricopharyngeal relaxation Thin Straw -- Puree Reduced cricopharyngeal relaxation Mechanical Soft -- Regular -- Multi-consistency -- Pill -- Cervical Esophageal Comment -- Germain Osgood 04/17/2018, 2:24 PM  Germain Osgood, M.A. CCC-SLP Acute Rehabilitation Services Pager [phone] Office [phone]              Scheduled Meds: . albuterol  2.5 mg Nebulization TID  . enoxaparin (LOVENOX) injection  60 mg Subcutaneous Q12H  . enoxaparin (LOVENOX) injection  60 mg Subcutaneous NOW  . furosemide  40 mg Intravenous BID  . metoprolol tartrate  5 mg Intravenous Q6H  . sodium chloride  2 spray Each Nare Daily   Continuous Infusions: . amiodarone 30 mg/hr (04/18/18 1100)  . azithromycin    . cefTRIAXone (ROCEPHIN)  IV     PRN Meds:   Time spent: 25 minutes  Author: [name] MD [facility] Page via [email or web address]  04/18/2018 12:28 PM  If 7PM-7AM, please contact night-coverage at [email or web address], password [facility]

## 2018-04-18 NOTE — Code Documentation (Signed)
  Patient Name: Derrick Ayala   MRN: [id number]   Date of Birth/ Sex: 11-Nov-1934 , male      Admission Date: 04/11/2018  Attending Provider: [name], MD  Primary Diagnosis: SOB (shortness of breath)   Indication: Pt was in his usual state of health until this afternoon when central telemetry called noting patient was in vtach. On arrival to room, patient was unresponsive and pulseless. Code blue was subsequently called. At the time of arrival on scene, ACLS protocol was underway.   Technical Description:  - CPR performance duration:  20 minutes  - Was defibrillation or cardioversion used? yes  - Was external pacer placed? no  - Was patient intubated pre/post CPR? yes   Medications Administered: Y = Yes; Blank = No Amiodarone  y  Atropine    Calcium    Epinephrine  y  Lidocaine    Magnesium  y  Norepinephrine    Phenylephrine    Sodium bicarbonate    Vasopressin     Post CPR evaluation:  - Final Status - Was patient successfully resuscitated ? Yes - What is current rhythm? Wide complex, rate approximately 60 - What is current hemodynamic status? Stable although requires prompt critical care  Miscellaneous Information:  - Labs sent, including: Standard code  - Primary team notified?  Yes - at bedside during code  - Family Notified? Yes - en route to hospital  - Additional notes/ transfer status:  Pt escorted to [place] with primary MD. ICU     Johnn Krasowski, DO  04/18/2018, 3:00 PM

## 2018-04-18 NOTE — Progress Notes (Signed)
Progress Note  Patient Name: Derrick Ayala Date of Encounter: 04/18/2018  Primary Cardiologist: No primary care provider on file.   Subjective   In the process of receiving a Cortrak feeding tube right now. Remains in AFib RVR 110-130s, despite IV amiodarone and IV metoprolol. Good diuresis after furosemide. Alert, cooperative, difficult to understand.  Inpatient Medications    Scheduled Meds: . albuterol  2.5 mg Nebulization TID  . enoxaparin (LOVENOX) injection  60 mg Subcutaneous Q12H  . enoxaparin (LOVENOX) injection  60 mg Subcutaneous NOW  . furosemide  40 mg Intravenous BID  . metoprolol tartrate  5 mg Intravenous Q6H  . potassium chloride  40 mEq Per Tube Once  . sodium chloride  2 spray Each Nare Daily   Continuous Infusions: . amiodarone 30 mg/hr (04/18/18 1100)  . azithromycin    . cefTRIAXone (ROCEPHIN)  IV     PRN Meds: LORazepam, metoprolol tartrate   Vital Signs    Vitals:   04/18/18 0000 04/18/18 0625 04/18/18 0715 04/18/18 0728  BP:  (!) 145/97  (!) 136/102  Pulse:  82 87 76  Resp:   18 16  Temp:    (!) 97.4 F (36.3 C)  TempSrc:    Oral  SpO2: 95%  94% 95%  Weight:      Height:        Intake/Output Summary (Last 24 hours) at 04/18/2018 1318 Last data filed at 04/18/2018 1100 Gross per 24 hour  Intake 242.65 ml  Output 1800 ml  Net -1557.35 ml   Filed Weights   04/30/2018 1121  Weight: 63.5 kg    Telemetry    AFib w RVR - Personally Reviewed  ECG    No new tracing - Personally Reviewed  Physical Exam  Alert, calm. Wet cough. GEN: No acute distress.   Neck: No JVD Cardiac: irregular, no murmurs, rubs, or gallops.  Respiratory: Clear to auscultation bilaterally. GI: Soft, nontender, non-distended  MS: No edema; No deformity. Neuro:  Nonfocal  Psych: Normal affect   Labs    Chemistry Recent Labs  Lab 04/18/2018 1137  04/14/18 0405 04/16/18 0837 04/17/18 0228 04/17/18 1926 04/18/18 0245  NA 138   < > 139 139 141 142 140    K 2.5*   < > 4.1 3.6 3.0* 3.7 3.4*  CL 97*   < > 103 102 101 100 99  CO2 31   < > 29 22 28 28 28   GLUCOSE 113*   < > 109* 107* 95 110* 103*  BUN 8   < > 8 13 14 14 15   CREATININE 0.96   < > 0.96 1.10 1.09 1.11 1.02  CALCIUM 10.3   < > 10.0 10.8* 10.9* 11.1* 11.1*  PROT 6.6  --  6.6 7.2  --   --   --   ALBUMIN 2.7*  --  2.7* 2.8*  --   --   --   AST 23  --  18 28  --   --   --   ALT 15  --  14 18  --   --   --   ALKPHOS 66  --  71 71  --   --   --   BILITOT 1.5*  --  1.7* 1.5*  --   --   --   GFRNONAA >60   < > >60 >60 >60 >60 >60  GFRAA >60   < > >60 >60 >60 >60 >60  ANIONGAP 10   < >  7 15 12 14 13    < > = values in this interval not displayed.     Hematology Recent Labs  Lab 04/16/18 0837 04/17/18 0228 04/18/18 0245  WBC 6.5 7.4 8.4  RBC 3.59* 3.78* 3.87*  HGB 11.3* 11.7* 12.0*  HCT 35.1* 36.2* 37.4*  MCV 97.8 95.8 96.6  MCH 31.5 31.0 31.0  MCHC 32.2 32.3 32.1  RDW 17.5* 17.2* 17.5*  PLT 94* 141* 115*    Cardiac Enzymes Recent Labs  Lab 04/16/18 0837  TROPONINI 0.03*    Recent Labs  Lab 04/03/2018 1144  TROPIPOC 0.04     BNPNo results for input(s): BNP, PROBNP in the last 168 hours.   DDimer No results for input(s): DDIMER in the last 168 hours.   Radiology    Dg Swallowing Func-speech Pathology  Result Date: 04/17/2018 Objective Swallowing Evaluation: Type of Study: MBS-Modified Barium Swallow Study  Patient Details Name: Derrick Ayala MRN: [id number] Date of Birth: 07/05/1935 Today's Date: 04/17/2018 Time: SLP Start Time (ACUTE ONLY): 1206 -SLP Stop Time (ACUTE ONLY): 1229 SLP Time Calculation (min) (ACUTE ONLY): 23 min Past Medical History: Past Medical History: Diagnosis Date . Hypertension  . Hypokalemia  . New onset atrial fibrillation ([place]) 01/18/2018  startd on Eliquis recently . RBBB 01/18/2018 . 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 COLONOSCOPY WITH PROPOFOL N/A 10/19/2017  Procedure: COLONOSCOPY WITH PROPOFOL;  Surgeon: Laurence Spates, MD;  Location: [facility];  Service: Endoscopy;  Laterality: N/A; . ESOPHAGOGASTRODUODENOSCOPY (EGD) WITH PROPOFOL N/A 10/19/2017  Procedure: ESOPHAGOGASTRODUODENOSCOPY (EGD) WITH PROPOFOL;  Surgeon: Laurence Spates, MD;  Location: [facility];  Service: Endoscopy;  Laterality: N/A; . HERNIA REPAIR    12-13 yrs ago HPI: Pt is an 83 yo male admitted on 04/30/2018 with SOB, found to have postobstructive pneumonia with a possible pulmonary mass. 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 fib, HTN. Pt has reported difficulty swallowing to providers in the past per chart review. He was ordered to have MBS in 2015 and again in June 2019, but he did not comt to either appointment.  Subjective: alert but confused, difficulty providing history Assessment / Plan / Recommendation CHL IP CLINICAL IMPRESSIONS 04/17/2018 Clinical Impression Pt has a moderate-severe oropharyngeal and cervical esophageal, with chronic component suspected given h/o laryngeal cancer s/p XRT. His lingual manipulation for bolus cohesion and A/P transfer is limited, resulting in slow bolus movement, decreased cohesion, and lingual residue.  Pharyngeally he has significantly impaired base of tongue retraction, pharyngeal constriction, hyolaryngeal movement, and UES relaxation. Pt therefore has reduced epiglottic deflection, airway protection, and pharyngeal clearance. Pt has consitent penetration with thin liquids that he appears to sense once they hit the true vocal folds, but his spontaneous cough is not effective at clearing the airway and cognitively he does not always cough to command. Aspiration did occur with thin liquids as barium could not be cleared. As consistencies become thicker they offer increased airway protection during the swallow; however,  they also result in increasing amounts of residue that is penetrated then after the swallow. After only a few bites of puree, pt's pyriform sinuses and valleculae were filled with barium, which could never be completely cleared despite attempted strategies, use of yankauer/attempts at expectoration, and liquid rinses. Pt is at a high risk for poor nutrition/hydration as well  as aspiration. What is unclear is how this compared to his baseline. If there is some acute decompensation then there could be some potential for recovery; however, if this is near baseline as suspected from [facility], pt will likely benefit from [place] discussion regarding aspiration risk. Would maintain NPO status for now except for few ice chips, administered by RN only after oral care.  SLP Visit Diagnosis Dysphagia, oropharyngeal phase (R13.12);Dysphagia, pharyngoesophageal phase (R13.14) Attention and concentration deficit following -- Frontal lobe and executive function deficit following -- Impact on safety and function Severe aspiration risk;Risk for inadequate nutrition/hydration   CHL IP TREATMENT RECOMMENDATION 04/17/2018 Treatment Recommendations Therapy as outlined in treatment plan below   Prognosis 04/17/2018 Prognosis for Safe Diet Advancement Guarded Barriers to Reach Goals Cognitive deficits;Severity of deficits;Time post onset Barriers/Prognosis Comment -- CHL IP DIET RECOMMENDATION 04/17/2018 SLP Diet Recommendations NPO;Ice chips PRN after oral care Liquid Administration via -- Medication Administration Via alternative means Compensations -- Postural Changes --   CHL IP OTHER RECOMMENDATIONS 04/17/2018 Recommended Consults -- Oral Care Recommendations Oral care QID Other Recommendations Have oral suction available   CHL IP FOLLOW UP RECOMMENDATIONS 04/17/2018 Follow up Recommendations (No Data)   CHL IP FREQUENCY AND DURATION 04/17/2018 Speech Therapy Frequency (ACUTE ONLY) min 2x/week Treatment Duration 2 weeks      CHL IP ORAL PHASE  04/17/2018 Oral Phase Impaired Oral - Pudding Teaspoon -- Oral - Pudding Cup -- Oral - Honey Teaspoon -- Oral - Honey Cup -- Oral - Nectar Teaspoon -- Oral - Nectar Cup Weak lingual manipulation;Reduced posterior propulsion;Lingual/palatal residue;Decreased bolus cohesion;Premature spillage Oral - Nectar Straw -- Oral - Thin Teaspoon Weak lingual manipulation;Reduced posterior propulsion;Lingual/palatal residue;Decreased bolus cohesion;Premature spillage Oral - Thin Cup Weak lingual manipulation;Reduced posterior propulsion;Lingual/palatal residue;Decreased bolus cohesion;Premature spillage Oral - Thin Straw -- Oral - Puree Weak lingual manipulation;Reduced posterior propulsion;Lingual/palatal residue;Decreased bolus cohesion Oral - Mech Soft -- Oral - Regular -- Oral - Multi-Consistency -- Oral - Pill -- Oral Phase - Comment --  CHL IP PHARYNGEAL PHASE 04/17/2018 Pharyngeal Phase Impaired Pharyngeal- Pudding Teaspoon -- Pharyngeal -- Pharyngeal- Pudding Cup -- Pharyngeal -- Pharyngeal- Honey Teaspoon -- Pharyngeal -- Pharyngeal- Honey Cup -- Pharyngeal -- Pharyngeal- Nectar Teaspoon -- Pharyngeal -- Pharyngeal- Nectar Cup Reduced pharyngeal peristalsis;Reduced epiglottic inversion;Reduced anterior laryngeal mobility;Reduced laryngeal elevation;Reduced airway/laryngeal closure;Reduced tongue base retraction;Pharyngeal residue - valleculae;Pharyngeal residue - pyriform;Penetration/Aspiration during swallow;Penetration/Apiration after swallow Pharyngeal Material enters airway, remains ABOVE vocal cords and not ejected out Pharyngeal- Nectar Straw -- Pharyngeal -- Pharyngeal- Thin Teaspoon Reduced pharyngeal peristalsis;Reduced epiglottic inversion;Reduced anterior laryngeal mobility;Reduced laryngeal elevation;Reduced airway/laryngeal closure;Reduced tongue base retraction;Pharyngeal residue - valleculae;Pharyngeal residue - pyriform;Penetration/Aspiration during swallow Pharyngeal Material enters airway, remains ABOVE  vocal cords and not ejected out Pharyngeal- Thin Cup Reduced pharyngeal peristalsis;Reduced epiglottic inversion;Reduced anterior laryngeal mobility;Reduced laryngeal elevation;Reduced airway/laryngeal closure;Reduced tongue base retraction;Pharyngeal residue - valleculae;Pharyngeal residue - pyriform;Penetration/Aspiration during swallow;Penetration/Apiration after swallow Pharyngeal Material enters airway, passes BELOW cords and not ejected out despite cough attempt by patient Pharyngeal- Thin Straw -- Pharyngeal -- Pharyngeal- Puree Reduced pharyngeal peristalsis;Reduced epiglottic inversion;Reduced anterior laryngeal mobility;Reduced laryngeal elevation;Reduced airway/laryngeal closure;Reduced tongue base retraction;Pharyngeal residue - valleculae;Pharyngeal residue - pyriform;Penetration/Apiration after swallow Pharyngeal Material enters airway, remains ABOVE vocal cords and not ejected out Pharyngeal- Mechanical Soft -- Pharyngeal -- Pharyngeal- Regular -- Pharyngeal -- Pharyngeal- Multi-consistency -- Pharyngeal -- Pharyngeal- Pill -- Pharyngeal -- Pharyngeal Comment --  CHL IP CERVICAL ESOPHAGEAL PHASE 04/17/2018 Cervical Esophageal Phase Impaired Pudding Teaspoon -- Pudding Cup -- Honey Teaspoon -- Honey Cup --  Nectar Teaspoon -- Nectar Cup Reduced cricopharyngeal relaxation Nectar Straw -- Thin Teaspoon Reduced cricopharyngeal relaxation Thin Cup Reduced cricopharyngeal relaxation Thin Straw -- Puree Reduced cricopharyngeal relaxation Mechanical Soft -- Regular -- Multi-consistency -- Pill -- Cervical Esophageal Comment -- Germain Osgood 04/17/2018, 2:24 PM  Germain Osgood, M.A. CCC-SLP Acute Rehabilitation Services Pager [phone] Office [phone]              Cardiac Studies   04/16/18 TTE Left ventricle: The cavity size was normal. Systolic function was moderately to severely reduced. The estimated ejection fraction was in the range of 30% to 35%. Diffuse hypokinesis. There  is hypokinesis of the anterolateral, lateral, inferolateral, and inferior myocardium. The study is not technically sufficient to allow evaluation of LV diastolic function. - Ventricular septum: Septal motion showed abnormal function and dyssynergy. - Aortic valve: There was trivial regurgitation. - Mitral valve: There was moderate regurgitation. - Left atrium: The atrium was moderately dilated. - Right atrium: The atrium was mildly dilated. - Tricuspid valve: There was moderate regurgitation. - Pulmonary arteries: Systolic pressure was mildly increased. PA peak pressure: 41 mm Hg (S). - Pericardium, extracardiac: A small pericardial effusion was identified circumferential to the heart.  Patient Profile     83 y.o. male with persistent atrial fibrillation with RVR, CHF w moderately depressed LVEF, HTN, severe electrolyte imbalances, left lung cavitary mass +/- pneumonia, history of glottic squamous cell Ca/XRT with secondary dysphagia.  Assessment & Plan    1. AFib w RVR: unable to swallow - on IV amiodarone and metoprolol, avoid diltiazem due to negative inotropic effect. Switch metoprolol to feeding tube. Continue amiodarone IV until feedings start (unpredictable absorption without enteral feeding), hopefully switch to PO tomorrow.  2. CHF: Good diuresis. Will switch furosemide to PO also. Replete K. 3. Long QT: attributable in part to RBBB, also longer due to electrolyte abnormalities. Today's tracing measures 482/563 (on my measurement 440/corrected 513 ms). Continue to monitor.  4. Hypokalemia/hypomagnesemia: improving, not yet fully repleted. 5. Suspected lung Ca: on therapeutic dose enoxaparin.     For questions or updates, please contact [place] Please consult [email or web address] for contact info under        Signed, Sanda Klein, MD  04/18/2018, 1:18 PM

## 2018-04-18 NOTE — Consult Note (Signed)
Consultation Note Date: 04/18/2018   Patient Name: Derrick Ayala  DOB: 1935-05-12  MRN: [id number]  Age / Sex: 83 y.o., male  PCP: Lujean Amel, MD Referring Physician: [name], MD  Reason for Consultation: Establishing goals of care  HPI/Patient Profile: 84 y.o. male   admitted on 04/04/2018     Clinical Assessment and Goals of Care:  83 year old gentleman with a past medical history significant for squamous cell carcinoma of vocal cord status post radiation, history of atrial fibrillation hypertension. Patient has been admitted with shortness of breath, found to have postobstructive pneumonia, pulmonary mass. Pulmonology has been following. Cardiology has been following for atrial fibrillation with rapid ventricular response, acute systolic congestive heart failure diagnosed in this hospitalization. Hospital course also complicated by acute encephalopathy, patient requiring restraints.  Palliative medicine consultation for goals of care discussions has been requested.  Patient is an elderly appearing gentleman resting in bed. He is awake and alert. He is able to state his name. He knows he is in a hospital. He keeps asking for his daughter. Introduce myself and palliative care as follows: Palliative medicine is specialized medical care for people living with serious illness. It focuses on providing relief from the symptoms and stress of a serious illness. The goal is to improve quality of life for both the patient and the family.  Patient is simply states that I ought to get in touch with his daughter Derrick Ayala immediately. He keeps talking about that he has to go to [place] for a family vacation.  Call placed and discussed with daughter Derrick Ayala a [phone] per she states that she is his biological daughter. Patient has stepchildren through his wife Ms. Derrick Ayala. Daughter Derrick Ayala states that patient's by  Derrick Ayala recently had a stroke and is currently being discharged from the hospital to a skilled nursing facility for rehabilitation. Daughter states that the patient's wife is currently not able to participate in decision-making or discussions.  I discussed with the patient's daughter about the patient's current medical condition as well as scope of current hospitalization. Patient has postobstructive versus aspiration pneumonia, concern for necrotic lung mass, subcarinal lung mass with serious concern for this being a mallet and see. He has prolonged QT interval, he has had acute encephalopathy, he has had atrial fibrillation with rapid ventricular response. Speech pathology has been following,he was noted to not be safe with any kind of by mouth diet. We discussed frankly and compassionately with her that the patient was up against a lot of things.  Daughter states that she almost spent the entire night. The hospital. Her goals are for any and all life maintaining/life-prolonging measures to be given and continued. She states that the patient is a full code. She states he may have had a living will made but she is not sure as to the contents of it.   Daughter is asking about next steps as far as artificial nutrition and hydration she is asking for temporary insertion of tube, she states she is not opposed to feeding  tube if that is what is needed. She states it is her understanding that a bronchoscopy is going to be done to determine whether or not the patient has a lung malignancy. I answered all of her questions to the best of my ability. Please note additional discussions/recommendations as listed below. Thank you for the consult.  NEXT OF KIN  wife Derrick Ayala Daughter [id number] [phone]   SUMMARY OF RECOMMENDATIONS   Full code/ full scope of treatment.  Daughter is asking for temporary feeding tube placement or PEG tube placement if needed, patient had PEG tube placed previously when he was  undergoing radiation for SCC of glottis in the past. She would also like to be informed about when the Bronchoscopy is taking place.   Goals at this time, are not palliative/comfort focused. Daughter states that she is the patient's only biological child, patient's wife Derrick Ayala recently had a stroke and is currently reportedly undergoing rehab at a local SNF and is unavailable for conversations/decision making.   Thank you for the consult, PMT to continue to follow.   Code Status/Advance Care Planning:  Full code    Symptom Management:    as above   Palliative Prophylaxis:   Delirium Protocol  Additional Recommendations (Limitations, Scope, Preferences):  Full Scope Treatment  Psycho-social/Spiritual:   Desire for further Chaplaincy support:yes  Additional Recommendations: Caregiving  Support/Resources  Prognosis:   Unable to determine  Discharge Planning: To Be Determined      Primary Diagnoses: Present on Admission: . SOB (shortness of breath) . Postobstructive pneumonia . Essential hypertension . Glottis carcinoma ([place]) . Hypokalemia . New onset atrial fibrillation ([place]) . Hyperglycemia   I have reviewed the medical record, interviewed the patient and family, and examined the patient. The following aspects are pertinent.  Past Medical History:  Diagnosis Date  . Hypertension   . Hypokalemia   . New onset atrial fibrillation ([place]) 01/18/2018   startd on Eliquis recently  . RBBB 01/18/2018  . 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 Marital status: Married    Spouse name: Not on file  . Number of children: 1  . Years of education: Not on file  . Highest education level: Not on file  Occupational History  . Occupation: Rtired    Comment: Corporate investment banker  .  Financial resource strain: Not on file  . Food insecurity:    Worry: Not on file    Inability: Not on file  . Transportation needs:    Medical: Not on file    Non-medical: Not on file  Tobacco Use  . Smoking status: Former Smoker    Packs/day: 0.50    Years: 20.00    Pack years: 10.00    Last attempt to quit: 07/02/1999    Years since quitting: 18.8  . Smokeless tobacco: Former Systems developer    Types: [place] date: 09/04/2013  Substance and Sexual Activity  . Alcohol use: Not Currently    Comment: 1 beer or shot vodka or gin occasionally  . Drug use: Never  . Sexual activity: Yes    Birth control/protection: None  Lifestyle  . Physical activity:    Days per week: Not on file    Minutes per session: Not on file  . Stress: Not on file  Relationships  . Social connections:    Talks on phone: Not on file    Gets together: Not on file    Attends religious service: Not on file    Active member of club or organization: Not on file    Attends meetings of clubs or organizations: Not on file    Relationship status: Not on file  Other Topics Concern  . Not on file  Social History Narrative  . Not on file   Family History  Problem Relation Age of Onset  . Cancer Mother        unknown  . Cancer Brother        lung ca, smoker   Scheduled Meds: . albuterol  2.5 mg Nebulization TID  . enoxaparin (LOVENOX) injection  40 mg Subcutaneous Q24H  . furosemide  40 mg Intravenous BID  . metoprolol tartrate  5 mg Intravenous Q6H  . sodium chloride  2 spray Each Nare Daily   Continuous Infusions: . amiodarone 30 mg/hr (04/18/18 0826)   PRN Meds:.LORazepam, metoprolol tartrate Medications Prior to Admission:  Prior to Admission medications   Medication Sig Start Date End Date Taking? Authorizing Provider  betamethasone dipropionate (DIPROLENE) 0.05 % ointment Apply 1 application topically 2 (two) times daily.   Yes [provider]  Diphenhydramine-PE-APAP (NIGHTTIME COLD & FLU  MAX STR PO) Take 1 tablet by mouth at bedtime as needed (for congestion).   Yes [provider]  ELIQUIS 5 MG TABS tablet Take 5 mg by mouth 2 (two) times daily. 02/21/18  Yes [provider]  furosemide (LASIX) 20 MG tablet Take 20 mg by mouth daily. 03/30/18  Yes [provider]  irbesartan (AVAPRO) 300 MG tablet Take 300 mg by mouth daily.   Yes [provider]  metoprolol tartrate (LOPRESSOR) 25 MG tablet Take 25 mg by mouth 2 (two) times daily. 12/22/17  Yes [provider]  pseudoephedrine (SUDAFED) 120 MG 12 hr tablet Take 120 mg by mouth daily as needed for congestion.   Yes [provider]  sildenafil (VIAGRA) 100 MG tablet Take 100 mg by mouth as needed for erectile dysfunction.   Yes [provider]  sodium chloride (OCEAN) 0.65 % SOLN nasal spray Place 2 sprays into both nostrils daily.   Yes [provider]  tamsulosin (FLOMAX) 0.4 MG CAPS capsule Take 0.4 mg by mouth daily after breakfast.   Yes [provider]  triamcinolone cream (KENALOG) 0.1 % Apply 1 application topically 2 (two) times daily as needed (on affected area).    Yes [provider]   No Known Allergies Review of Systems Complains of feeling weak  Physical Exam Patient is awake, is able to state his name, is able to say that he is in a hospital Patient keeps asking for his daughter Patient has faint crackles S1-S2 is regular Patient has trace edema He has large appearing dentures, is able to speak although sometimes his words are appearing muffled Abdomen is not distended He is in restraints currently  Vital Signs: BP (!) 136/102 (BP Location: Left Arm)   Pulse 76   Temp (!) 97.4 F (36.3 C) (Oral)   Resp 16   Ht 5\' 11"  (1.803 m)   Wt 63.5 kg   SpO2 95%   BMI 19.53 kg/m  Pain Scale: 0-10   Pain Score: 0-No pain   SpO2: SpO2: 95 % O2 Device:SpO2: 95 % O2 Flow Rate: .O2 Flow Rate (L/min): 2.5 L/min  IO:  Intake/output  summary:   Intake/Output Summary (Last 24 hours) at 04/18/2018 6269 Last data filed at 04/18/2018 0000 Gross per 24 hour  Intake 76.68 ml  Output 1500 ml  Net -1423.32 ml    LBM: Last BM Date: 04/17/18 Baseline Weight: Weight: 63.5 kg Most recent weight: Weight: 63.5 kg     Palliative Assessment/Data:   PPS 40%  Time In:  8.10 Time Out:  9.10 Time Total:  60 min  Greater than 50%  of this time was spent counseling and coordinating care related to the above assessment and plan.  Signed by: Loistine Chance, MD  [phone] Please contact Palliative Medicine Team phone at [phone] for questions and concerns.  For individual provider: See Shea Evans

## 2018-04-18 NOTE — Procedures (Signed)
Arterial Catheter Insertion Procedure Note Derrick Ayala [id number] 1934-11-14  Procedure: Insertion of Arterial Catheter  Indications: Blood pressure monitoring and Frequent blood sampling  Procedure Details Consent: Risks of procedure as well as the alternatives and risks of each were explained to the (patient/caregiver).  Consent for procedure obtained.   Time Out: Verified patient identification, verified procedure, site/side was marked, verified correct patient position, special equipment/implants available, medications/allergies/relevent history reviewed, required imaging and test results available.  Performed  Maximum sterile technique was used including antiseptics, cap, gloves, gown, hand hygiene, mask and sheet. Skin prep: Chlorhexidine; local anesthetic administered 20 gauge catheter was inserted into right radial artery using the Seldinger technique. ULTRASOUND GUIDANCE USED: YES Evaluation Blood flow good; BP tracing good. Complications: No apparent complications.   Procedure performed under direct supervision of Dr. Nelda Marseille and with ultrasound guidance for real time vessel cannulation.      Derrick Gens, NP-C [facility] Pgr: [phone] or if no answer [phone] 04/18/2018, 3:46 PM

## 2018-04-18 NOTE — Procedures (Signed)
Arterial Catheter Insertion Procedure Note Derrick Ayala [id number] June 23, 1935  Procedure: Insertion of Arterial Catheter  Indications: Blood pressure monitoring and Frequent blood sampling  Procedure Details Consent: Unable to obtain consent because of emergent medical necessity. Time Out: Verified patient identification, verified procedure, site/side was marked, verified correct patient position, special equipment/implants available, medications/allergies/relevent history reviewed, required imaging and test results available.  Performed  Maximum sterile technique was used including antiseptics, cap, gloves, gown, hand hygiene, mask and sheet. Skin prep: Chlorhexidine; local anesthetic administered 20 gauge catheter was inserted into right radial artery using the Seldinger technique. ULTRASOUND GUIDANCE USED: NO Evaluation Blood flow good; BP tracing good. Complications: No apparent complications.   [name] 04/18/2018

## 2018-04-18 NOTE — Progress Notes (Signed)
Initial Nutrition Assessment  DOCUMENTATION CODES:   Severe malnutrition in context of chronic illness  INTERVENTION:    Initiate Jevity 1.2 at 15 ml/hr and increase by 10 ml every 8 hours to goal rate of 55 ml/hr  Provides 1584 kcals, 73 gm protein, 1065 ml of free water daily  Monitor Mg, Phos, K+  NUTRITION DIAGNOSIS:   Severe Malnutrition related to chronic illness(CHF, suspected lung cancer) as evidenced by severe muscle depletion, severe fat depletion  GOAL:   Patient will meet greater than or equal to 90% of their needs  MONITOR:   TF tolerance, Diet advancement, Labs, Weight trends, Skin  REASON FOR ASSESSMENT:   Consult Enteral/tube feeding initiation and management  ASSESSMENT:   83 yo Male with PMH of former smoker, 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 fib, HTN; admitted on , presented with complaint of shortness of breath, was found to have postobstructive pneumonia with pulmonary mass.  RD unable to obtain nutrition history. Pt confused. S/p MBSS 9/17. SLP rec NPO status. Pt at high aspiration risk. Plan is for Cortrak 10 F tube placement for nutrition support.  Palliative Medicine Team note 9/18 reviewed. Labs & medications reviewed. K 3.4 (L). Plan is not for comfort at this time.  Pt is at refeeding risk with nutrition support initiation given malnutrition.  NUTRITION - FOCUSED PHYSICAL EXAM:    Most Recent Value  Orbital Region  Moderate depletion  Upper Arm Region  Severe depletion  Thoracic and Lumbar Region  Unable to assess  Buccal Region  Moderate depletion  Temple Region  Moderate depletion  Clavicle Bone Region  Severe depletion  Clavicle and Acromion Bone Region  Severe depletion  Scapular Bone Region  Unable to assess  Dorsal Hand  Unable to assess  Patellar Region  Severe depletion  Anterior Thigh Region  Severe depletion  Posterior Calf Region  Severe depletion  Edema (RD Assessment)  None     Diet Order:    Diet Order            Diet NPO time specified Except for: Sips with Meds  Diet effective now             EDUCATION NEEDS:   Not appropriate for education at this time  Skin:  Skin Assessment: Reviewed RN Assessment  Last BM:  9/17  Height:   Ht Readings from Last 1 Encounters:  04/21/2018 5\' 11"  (1.803 m)   Weight:   Wt Readings from Last 1 Encounters:  04/09/2018 63.5 kg   Ideal Body Weight:  78.1 kg  BMI:  Body mass index is 19.53 kg/m.  Estimated Nutritional Needs:   Kcal:  1500-1700  Protein:  70-80 gm  Fluid:  1.5-1.7 L  Arthur Holms, RD, LDN Pager #: [phone] After-Hours Pager #: [phone]

## 2018-04-18 NOTE — Procedures (Signed)
Central Venous Catheter Insertion Procedure Note Derrick Ayala [id number] 01/05/1935  Procedure: Insertion of Central Venous Catheter Indications: Assessment of intravascular volume, Drug and/or fluid administration and Frequent blood sampling  Procedure Details Consent: Unable to obtain consent because of emergent medical necessity. Time Out: Verified patient identification, verified procedure, site/side was marked, verified correct patient position, special equipment/implants available, medications/allergies/relevent history reviewed, required imaging and test results available.  Performed  Maximum sterile technique was used including gloves, hand hygiene, mask and sheet. Skin prep: Chlorhexidine; local anesthetic administered A antimicrobial bonded/coated triple lumen catheter was placed in the right femoral vein due to emergent situation using the Seldinger technique.  Evaluation Blood flow good Complications: No apparent complications Patient did tolerate procedure well. Chest X-ray ordered to verify placement.  CXR: N/A.  Derrick Ayala 04/18/2018, 4:08 PM

## 2018-04-19 ENCOUNTER — Inpatient Hospital Stay (HOSPITAL_COMMUNITY): Payer: Medicare Other

## 2018-04-19 DIAGNOSIS — R570 Cardiogenic shock: Secondary | ICD-10-CM

## 2018-04-19 DIAGNOSIS — A419 Sepsis, unspecified organism: Secondary | ICD-10-CM

## 2018-04-19 DIAGNOSIS — R6521 Severe sepsis with septic shock: Secondary | ICD-10-CM

## 2018-04-19 DIAGNOSIS — I472 Ventricular tachycardia: Secondary | ICD-10-CM

## 2018-04-19 LAB — BASIC METABOLIC PANEL
Anion gap: 12 (ref 5–15)
BUN: 18 mg/dL (ref 8–23)
CHLORIDE: 103 mmol/L (ref 98–111)
CO2: 28 mmol/L (ref 22–32)
CREATININE: 1.18 mg/dL (ref 0.61–1.24)
Calcium: 10.2 mg/dL (ref 8.9–10.3)
GFR calc non Af Amer: 56 mL/min — ABNORMAL LOW (ref >60)
Glucose, Bld: 100 mg/dL — ABNORMAL HIGH (ref 70–99)
Potassium: 2.4 mmol/L — CL (ref 3.5–5.1)
Sodium: 143 mmol/L (ref 135–145)

## 2018-04-19 LAB — POCT I-STAT 3, ART BLOOD GAS (G3+)
Acid-Base Excess: 8 mmol/L — ABNORMAL HIGH (ref 0.0–2.0)
Bicarbonate: 30.1 mmol/L — ABNORMAL HIGH (ref 20.0–28.0)
O2 SAT: 99 %
PCO2 ART: 30.5 mmHg — AB (ref 32.0–48.0)
TCO2: 31 mmol/L (ref 22–32)
pH, Arterial: 7.601 (ref 7.350–7.450)
pO2, Arterial: 105 mmHg (ref 83.0–108.0)

## 2018-04-19 LAB — CBC
HCT: 34.1 % — ABNORMAL LOW (ref 39.0–52.0)
Hemoglobin: 11.3 g/dL — ABNORMAL LOW (ref 13.0–17.0)
MCH: 31.4 pg (ref 26.0–34.0)
MCHC: 33.1 g/dL (ref 30.0–36.0)
MCV: 94.7 fL (ref 78.0–100.0)
PLATELETS: 100 10*3/uL — AB (ref 150–400)
RBC: 3.6 MIL/uL — AB (ref 4.22–5.81)
RDW: 17.6 % — ABNORMAL HIGH (ref 11.5–15.5)
WBC: 7.5 10*3/uL (ref 4.0–10.5)

## 2018-04-19 LAB — GLUCOSE, CAPILLARY
GLUCOSE-CAPILLARY: 95 mg/dL (ref 70–99)
Glucose-Capillary: 106 mg/dL — ABNORMAL HIGH (ref 70–99)
Glucose-Capillary: 99 mg/dL (ref 70–99)
Glucose-Capillary: 97 mg/dL (ref 70–99)
Glucose-Capillary: 117 mg/dL — ABNORMAL HIGH (ref 70–99)

## 2018-04-19 LAB — MAGNESIUM: MAGNESIUM: 2 mg/dL (ref 1.7–2.4)

## 2018-04-19 LAB — PHOSPHORUS: Phosphorus: 2.2 mg/dL — ABNORMAL LOW (ref 2.5–4.6)

## 2018-04-19 MED ORDER — POTASSIUM CHLORIDE 20 MEQ/15ML (10%) PO SOLN
40.0000 meq | ORAL | Status: AC
  Administered 2018-04-19 (×2): 40 meq
  Filled 2018-04-19 (×3): qty 30

## 2018-04-19 MED ORDER — FAMOTIDINE IN NACL 20-0.9 MG/50ML-% IV SOLN: 20.0000 mg | INTRAVENOUS | Status: DC

## 2018-04-19 MED ORDER — VANCOMYCIN HCL IN DEXTROSE 750-5 MG/150ML-% IV SOLN
750.0000 mg | Freq: Two times a day (BID) | INTRAVENOUS | Status: DC
  Administered 2018-04-19: 750 mg via INTRAVENOUS
  Filled 2018-04-19 (×2): qty 150

## 2018-04-19 MED ORDER — ALBUTEROL SULFATE (2.5 MG/3ML) 0.083% IN NEBU
2.5000 mg | INHALATION_SOLUTION | Freq: Four times a day (QID) | RESPIRATORY_TRACT | Status: DC
  Administered 2018-04-19 (×2): 2.5 mg via RESPIRATORY_TRACT
  Filled 2018-04-19 (×2): qty 3

## 2018-04-19 MED ORDER — HEPARIN (PORCINE) IN NACL 100-0.45 UNIT/ML-% IJ SOLN
1150.0000 [IU]/h | INTRAMUSCULAR | Status: DC
  Administered 2018-04-19: 1150 [IU]/h via INTRAVENOUS
  Filled 2018-04-19: qty 250

## 2018-04-19 MED ORDER — VITAL HIGH PROTEIN PO LIQD
1000.0000 mL | ORAL | Status: DC
  Administered 2018-04-19: 1000 mL

## 2018-04-20 MED FILL — Medication: Qty: 1 | Status: AC

## 2018-05-01 NOTE — Progress Notes (Signed)
RT Note; patient transported to CT Scan and back. Patient is still intubated, on a ventilator.  Pt stable, no issues noted.

## 2018-05-01 NOTE — Progress Notes (Signed)
PULMONARY / CRITICAL CARE MEDICINE   NAME:  Derrick Ayala, MRN:  [id number], DOB:  [birth year], LOS: 7 ADMISSION DATE:  04/10/2018, CONSULTATION DATE:  04/13/2018 REFERRING MD:  Dr. Posey Pronto, [facility] CHIEF COMPLAINT:  Cough  BRIEF HISTORY:    83 yo male former smoker presented with progressive cough with sputum.  Found to have mass like consolidation on CT chest.  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  HISTORY OF PRESENT ILLNESS   83 yo male has cough with sputum for past 6 months.  This has been getting worse.  Was bringing up green sputum with some blood streaking.  Not having fever, or chest pain.  Was seen by PCP.  Had CXR and then CT chest, and advised he needed treatment for pneumonia.  Started on ABx.   SIGNIFICANT PAST MEDICAL HISTORY   HTN, A fib, SCC Lt vocal cord s/p XRT  SIGNIFICANT EVENTS:  9/12 Admit 9/18: Patient had what appears to be an aspiration event and suffered a cardiac arrest.  Palliative care had seen the patient but family wished for all resuscitative measures.   9/19: Heavily sedated on fentanyl infusion.  Remains on low-dose dopamine  STUDIES:   CT chest 9/12 >> 5.4 cm subcarinal mass, b/l pleural calcifications, small b/l effusions, occluded LLL bronchus, 6.8 cm mass Lt infrahilar region, moderate centrilobular emphysema (reviewed by me)  CULTURES:  Blood 9/12 >> Sputum 9/12 >>   ANTIBIOTICS:  Rocephin 9/12 >> 9/18 Zithromax 9/12 >>9/18 Vancomycin 9/18 Zosyn 9/18  LINES/TUBES:  9/18: Endotracheal tube>>> 9/18: Right radial A-line>>> 9/18: Right femoral central line>>  CONSULTANTS:  Pulmonary 9/13 Palliative care consulted 9/18 SUBJECTIVE:  Unresponsive, heavily sedated.  CONSTITUTIONAL: Blood Pressure (Abnormal) 115/52   Pulse 64   Temperature 97.8 F (36.6 C) (Oral)   Respiration 13   Height 5\' 11"  (1.803 m)   Weight 76.4 kg   Oxygen Saturation 99%   Body Mass Index 23.49 kg/m   I/O last 3 completed shifts: In: 1582.5  [I.V.:648.3; IV Piggyback:934.2] Out: 1000 [Urine:1000]  CVP:  [0 mmHg-12 mmHg] 2 mmHg  Vent Mode: PRVC FiO2 (%):  [30 %-100 %] 30 % Set Rate:  [14 bmp-18 bmp] 14 bmp Vt Set:  [550 mL] 550 mL PEEP:  [5 cmH20] 5 cmH20 Plateau Pressure:  [9 UKG25-42 cmH20] 9 cmH20  PHYSICAL EXAM: General: 83 year old male patient currently heavily sedated on fentanyl infusion HEENT: Normocephalic/atraumatic has an upward gaze to the left orally intubated pupils equal reactive Pulmonary: Equal chest rise on mechanically assisted breath.  Diminished left base Cardiac: Irregularly irregular.  Atrial fibrillation on telemetry noted.  Has a positive S4 as well as holosystolic murmur  Abdomen: Soft, not tender no organomegaly appreciated Extremities: Cool, 2+ lower extremity edema strong pulses Neuro: Sedated on propofol, reaches towards endotracheal tube during cough but no purposeful movement otherwise appreciated  Resolved/inactive problems History of laryngeal cancer  ASSESSMENT AND PLAN    S/p VT/VF cardiac arrest felt mediated by aspiration event.  Now complicated by mix of cardiogenic and septic shock Plan Continue to titrate vasoactive drips for mean arterial pressure greater than 65 Keep euvolemic today Correct electrolytes to limit risk of further EKG disturbance Continue telemetry monitoring Currently he is a limited code, however I think we should re-discuss this with family  Atrial fibrillation, complicated now by bradycardia -Amiodarone discontinued -Beta-blockers on hold due to hypotension Plan Continue telemetry monitoring IV heparin assuming CT head negative stopping Lovenox I am going to  stop lidocaine, risk of toxicity, also question of seizure  Acute hypoxic respiratory failure: Presumed secondary to aspiration event/aspiration pneumonia complicated possibly by element of volume overload -Portable chest x-ray: Endotracheal tube in satisfactory position.  Multiple metallic  objects continue to be appreciated.  Left greater than right airspace disease with element of both effusion and probable pneumonia on the left -Ventilator support minimal currently, mental status prohibiting weaning Plan Continuing full ventilator support VAP bundle Pulse oximetry PAD protocol, RASS goal 0 Day #2 vancomycin and Zosyn Follow-up sputum culture   Concern for anoxic brain injury -Time to return of spontaneous circulation estimated at 5 minutes per review of code sheet. -Was hypotensive following -Currently exhibiting decorticate type posturing with gaze preference to the left Plan CT head Change PAD protocol for RASS goal -1 EEG May need neurological evaluation  Fluid and electrolyte balance: Hypokalemia Plan: Replace potassium Recheck a.m. Chemistry  Anemia of critical illness Plan Trend CBC  Lung mass Plan Not a candidate for diagnostics at this point    SUMMARY OF TODAY'S PLAN:  Now status post arrest.  Downtime estimated at 5 minutes but was hypotensive.  Exhibiting decorticate posturing and upward left gaze raising concern for anoxic injury.  Remains hypotensive requiring vasoactive drips.  Goal for now is to obtain CT head, EEG, decrease sedation, and reassess neurological findings.  He is a limited code, however I think we need to reassess this after neurodiagnostic imaging and testing  Best Practice / Goals of Care / Disposition.   DVT PROPHYLAXIS: IV heparin SUP: Pepcid NUTRITION: Tube feed  mOBILITY: bed rest GOALS OF CARE: DO NOT RESUSCITATE, epinephrine only. FAMILY DISCUSSIONS: no family at bedside  D/w Dr. Posey Pronto  LABS  Glucose Recent Labs  Lab 04/18/18 1628 04/18/18 1955 04/18/18 2333 05/05/18 0327 05-May-2018 0740  GLUCAP 98 112* 99 106* 95    BMET Recent Labs  Lab 04/17/18 1926 04/18/18 0245 05-May-2018 0737  NA 142 140 143  K 3.7 3.4* 2.4*  CL 100 99 103  CO2 28 28 28   BUN 14 15 18   CREATININE 1.11 1.02 1.18  GLUCOSE  110* 103* 100*    Liver Enzymes Recent Labs  Lab  1137 04/14/18 0405 04/16/18 0837  AST 23 18 28   ALT 15 14 18   ALKPHOS 66 71 71  BILITOT 1.5* 1.7* 1.5*  ALBUMIN 2.7* 2.7* 2.8*    Electrolytes Recent Labs  Lab 04/17/18 0228 04/17/18 1926 04/18/18 0245 May 05, 2018 0737  CALCIUM 10.9* 11.1* 11.1* 10.2  MG 1.9 1.7 1.7  --     CBC Recent Labs  Lab 04/17/18 0228 04/18/18 0245 05-05-18 0737  WBC 7.4 8.4 7.5  HGB 11.7* 12.0* 11.3*  HCT 36.2* 37.4* 34.1*  PLT 141* 115* 100*    ABG Recent Labs  Lab 04/16/18 0242 04/18/18 1612  PHART 7.427 7.548*  PCO2ART 36.0 36.4  PO2ART 129* 318.0*    Coag's No results for input(s): APTT, INR in the last 168 hours.  Sepsis Markers Recent Labs  Lab 04/17/2018 1441  LATICACIDVEN 2.03*    Cardiac Enzymes Recent Labs  Lab 04/16/18 0837  TROPONINI 0.03*   Erick Colace ACNP-BC [place] Pager # [phone] OR # [phone] if no answer  05-05-18, 8:37 AM

## 2018-05-01 NOTE — Progress Notes (Signed)
ANTICOAGULATION CONSULT NOTE - Initial Consult  Pharmacy Consult for heparin Indication: atrial fibrillation  No Known Allergies  Patient Measurements: Height: 5\' 11"  (180.3 cm) Weight: 168 lb 6.9 oz (76.4 kg) IBW/kg (Calculated) : 75.3 Heparin Dosing Weight: 76kg  Vital Signs: Temp: 97.1 F (36.2 C) (09/19 1150) Temp Source: Oral (09/19 1150) BP: 94/62 (09/19 1200) Pulse Rate: 50 (09/19 1200)  Labs: Recent Labs    04/17/18 0228 04/17/18 1926 04/18/18 0245 04/25/18 0737  HGB 11.7*  --  12.0* 11.3*  HCT 36.2*  --  37.4* 34.1*  PLT 141*  --  115* 100*  CREATININE 1.09 1.11 1.02 1.18    Estimated Creatinine Clearance: 52.3 mL/min (by C-G formula based on SCr of 1.18 mg/dL).   Medical History: Past Medical History:  Diagnosis Date  . Hypertension   . Hypokalemia   . New onset atrial fibrillation ([place]) 01/18/2018   startd on Eliquis recently  . RBBB 01/18/2018  . 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 Pt had been receiving enoxaparin injections, last dose of apixaban was on 9/12. Last dose of enoxaparin 9/18  At 2245, CT head negative for hemorrhage this morning.  Goal of Therapy:  Heparin level 0.3-0.7 units/ml Monitor platelets by anticoagulation protocol: Yes   Plan:  -Start IV heparin 1150 units/hr -Check 8hr heparin level -Daily heparin level, CBC  Arrie Senate, PharmD, BCPS Clinical Pharmacist [phone] Please check AMION for all [facility] numbers 25-Apr-2018

## 2018-05-01 NOTE — Progress Notes (Signed)
Pt cortrak not in correct place. Cortrak team notified, awaiting assessment and advancement. No PO meds can be given at this time.

## 2018-05-01 NOTE — Progress Notes (Signed)
Pt brady in 30's at times. Family at the bedside, NP notified for family discussion. No new orders at this time.

## 2018-05-01 NOTE — Progress Notes (Signed)
Nutrition Follow-up  DOCUMENTATION CODES:   Severe malnutrition in context of chronic illness  INTERVENTION:   Tube Feeding:  Vital High Protein @ 65 ml/hr Provides 1560 kcals, 137 g of protein and 1310 mL of free water Meets 100% estimated protein, calorie needs Pt at risk for refeeding; begin at 20 ml/hr and titrate by 10 mL q 8 hours until goal rate Checking phosphorus and magnesium q 12 hours for at least 4 occurrences (or until wdl) and MD to replete as needed   NUTRITION DIAGNOSIS:   Severe Malnutrition related to chronic illness(CHF, suspected lung cancer) as evidenced by severe muscle depletion, severe fat depletion.  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 fib, HTN; admitted on 04/07/2018, presented with complaint of shortness of breath, was found to have postobstructive pneumonia with pulmonary mass.  8/12 Admit 9/18 Cortrak place, cardiac arrest, possible aspiration, intubated  Concern for anoxic brain injury, noted plan for CT of brain, EEG Patient is currently intubated on ventilator support MV: 7.4 L/min Temp (24hrs), Avg:97.4 F (36.3 C), Min:96.8 F (36 C), Max:97.8 F (36.6 C)  Cortrak tube placed yesterday and bridled at 89 cm, Jevity 1.2 TF ordered but never initiated On assessment today, Cortrak tube noted to be almost completely out of patient nare. Plan for removal and placement of OG tube  Labs: potassium 2.4 (L), Creatinine wdl Meds: KCl, dopmaine, fentanyl  Diet Order:   Diet Order            Diet NPO time specified  Diet effective now              EDUCATION NEEDS:   Not appropriate for education at this  time  Skin:  Skin Assessment: Reviewed RN Assessment  Last BM:  9/18  Height:   Ht Readings from Last 1 Encounters:  04/24/2018 5\' 11"  (1.803 m)    Weight:   Wt Readings from Last 1 Encounters:  04/18/18 76.4 kg    Ideal Body Weight:  78.1 kg  BMI:  Body mass index is 23.49 kg/m.  Estimated Nutritional Needs:   Kcal:  1566 kcals   Protein:  114-137 g   Fluid:  >/= 1.5 L   Kerman Passey MS, RD, LDN, CNSC [phone] Pager  [phone] Weekend/On-Call Pager

## 2018-05-01 NOTE — Progress Notes (Signed)
Bedside EEG completed; results pending. 

## 2018-05-01 NOTE — Progress Notes (Signed)
PT Cancellation Note  Patient Details Name: Derrick Ayala MRN: [id number] DOB: 1934-09-22   Cancelled Treatment:    Reason Eval/Treat Not Completed: Patient not medically ready(noted medical decline 9/18 with CPR and intubation. Will sign off and await new order)   Derrick Ayala 2018-05-07, 7:11 AM [place], PT Acute Rehabilitation Services Pager: [phone] Office: [phone]

## 2018-05-01 NOTE — Progress Notes (Signed)
Pharmacy Antibiotic Note  Derrick Ayala is a 83 y.o. male who was being treated for CAP prior to code blue called on 9/18 for VT arrest. During the code, the patient had a central line placed by CCM. Pharmacy has been consulted for vancomycin and Zosyn dosing for empiric coverage.  SCr up overnight with declining UOP, will adjust vancomycin empirically.  Plan: -Vancomycin to 750mg  IV q12h -Continue Zosyn 3.375g IV q8h (4 hour infusion). -Follow up cultures, LOT, and vancomycin trough PRN.  Height: 5\' 11"  (180.3 cm) Weight: 168 lb 6.9 oz (76.4 kg) IBW/kg (Calculated) : 75.3  Temp (24hrs), Avg:97.4 F (36.3 C), Min:96.8 F (36 C), Max:97.8 F (36.6 C)  Recent Labs  Lab 04/29/2018 1441  04/14/18 0405 04/16/18 0837 04/17/18 0228 04/17/18 1926 04/18/18 0245 May 06, 2018 0737  WBC  --    < > 5.8 6.5 7.4  --  8.4 7.5  CREATININE  --    < > 0.96 1.10 1.09 1.11 1.02 1.18  LATICACIDVEN 2.03*  --   --   --   --   --   --   --    < > = values in this interval not displayed.    Estimated Creatinine Clearance: 52.3 mL/min (by C-G formula based on SCr of 1.18 mg/dL).    No Known Allergies  Antimicrobials this admission: Azithromycin 9/12 >> 9/17 Ceftriaxone 9/12 >> 9/17 Zosyn x 1 9/12; 7/18 >> Vancomycin 7/18 >>  Dose adjustments this admission: none  Microbiology results: 9/12 BCx: ngF 9/16 UCx: ngF 9/18 Sputum: normal flora   Thank you for allowing pharmacy to be a part of this patient's care.  Arrie Senate, PharmD, BCPS Clinical Pharmacist [phone] Please check AMION for all [place] numbers 05-06-2018

## 2018-05-01 NOTE — Procedures (Signed)
ELECTROENCEPHALOGRAM REPORT   Patient: Derrick Ayala       Room #: 2H21C EEG No. ID: [id number] Age: 83 y.o.        Sex: male Referring Physician: Nelda Marseille Report Date:  2018/05/09        Interpreting Physician: Alexis Goodell  History: Derrick Ayala is an 83 y.o. male with altered mental status and starring episode  Medications:  Pepcid, Zosyn, Vancomycin, Dopamine, Epinephrine, Fentanyl, Heparin  Conditions of Recording:  This is a 21 channel routine scalp EEG performed with bipolar and monopolar montages arranged in accordance to the international 10/20 system of electrode placement. One channel was dedicated to EKG recording.  The patient is in the intubated and sedated state.  Description:  The background activity consists of a mixture of moderate voltage, poorly organized delta and theta activity that is diffusely distributed.  This activity is not continuous and there are intermittent periods of attenuation lasting up to 1 second that are seen on occasion during the tracing .  These periods of attenuation are generalized and synchronous between the hemispheres.   No epileptiform activity is noted. Hyperventilation and intermittent photic stimulation were not performed.  IMPRESSION: This is an abnormal EEG due to a discontinuous slow background.  No epileptiform activity is noted.  This finding is consistent with the patient's current medications.    Alexis Goodell, MD Neurology [phone] 05-09-2018, 3:58 PM

## 2018-05-01 NOTE — Progress Notes (Signed)
Pt. Passed away at 1928. Rn listened for breath and heart sounds for 60 seconds. No breath or lung sounds heard. Second RN, Baldwin [place] also listened for a full minute. No breath or lung sounds heard.

## 2018-05-01 NOTE — Progress Notes (Signed)
Progress Note  Patient Name: Derrick Ayala Date of Encounter: 2018/05/11  Primary Cardiologist:  Radford Pax  Subjective   No major arrhythmic events overnight.  Remains in atrial fibrillation with spontaneously slow ventricular response.  Lidocaine has been discontinued and he has not received amiodarone since yesterday's episode of ventricular tachycardia.  Beta-blockers have been held due to hypotension.  He is on a very low dose of intravenous dopamine. Sedated/mechanically ventilated. Marked hypokalemia in the setting of metabolic alkalosis.  Inpatient Medications    Scheduled Meds: . albuterol  2.5 mg Nebulization Q6H  . chlorhexidine  15 mL Mouth Rinse BID  . Chlorhexidine Gluconate Cloth  6 each Topical Daily  . mouth rinse  15 mL Mouth Rinse q12n4p  . potassium chloride  40 mEq Per Tube Q4H  . sodium chloride  2 spray Each Nare Daily  . sodium chloride flush  10-40 mL Intracatheter Q12H   Continuous Infusions: . sodium chloride 1,000 mL (04/18/18 1701)  . DOPamine 2 mcg/kg/min (05-11-18 0800)  . epinephrine    . famotidine (PEPCID) IV    . feeding supplement (JEVITY 1.2 CAL)    . fentaNYL infusion INTRAVENOUS 250 mcg/hr (05/11/2018 0834)  . piperacillin-tazobactam (ZOSYN)  IV 12.5 mL/hr at 2018-05-11 0800  . vancomycin     PRN Meds: sodium chloride, fentaNYL (SUBLIMAZE) injection, sodium chloride flush   Vital Signs    Vitals:   May 11, 2018 0700 May 11, 2018 0800 May 11, 2018 0814 2018/05/11 0856  BP: 90/62 103/68 (!) 115/52   Pulse: (!) 49 (!) 50 64 (!) 50  Resp: 14 14 13    Temp:  97.8 F (36.6 C)    TempSrc:  Oral    SpO2: 99% 99% 99%   Weight:      Height:        Intake/Output Summary (Last 24 hours) at 05/11/18 0928 Last data filed at May 11, 2018 0800 Gross per 24 hour  Intake 1608.92 ml  Output 400 ml  Net 1208.92 ml   Filed Weights   04/13/2018 1121 04/18/18 1340  Weight: 63.5 kg 76.4 kg    Telemetry    Atrial fibrillation with slow ventricular response.  QT  interval approximately 500 ms at a heart rate of 50 bpm- Personally Reviewed  ECG    No new tracing.- Personally Reviewed  Physical Exam  Intubated/mechanically ventilated/sedated GEN: No acute distress.   Neck: No JVD Cardiac:  Irregular, no murmurs, rubs, or gallops.  Respiratory: Clear to auscultation bilaterally. GI: Soft, nontender, non-distended  MS: No edema; No deformity. Neuro:  Nonfocal  Psych: Normal affect   Labs    Chemistry Recent Labs  Lab 04/23/2018 1137  04/14/18 0405 04/16/18 0837  04/17/18 1926 04/18/18 0245 May 11, 2018 0737  NA 138   < > 139 139   < > 142 140 143  K 2.5*   < > 4.1 3.6   < > 3.7 3.4* 2.4*  CL 97*   < > 103 102   < > 100 99 103  CO2 31   < > 29 22   < > 28 28 28   GLUCOSE 113*   < > 109* 107*   < > 110* 103* 100*  BUN 8   < > 8 13   < > 14 15 18   CREATININE 0.96   < > 0.96 1.10   < > 1.11 1.02 1.18  CALCIUM 10.3   < > 10.0 10.8*   < > 11.1* 11.1* 10.2  PROT 6.6  --  6.6 7.2  --   --   --   --  ALBUMIN 2.7*  --  2.7* 2.8*  --   --   --   --   AST 23  --  18 28  --   --   --   --   ALT 15  --  14 18  --   --   --   --   ALKPHOS 66  --  71 71  --   --   --   --   BILITOT 1.5*  --  1.7* 1.5*  --   --   --   --   GFRNONAA >60   < > >60 >60   < > >60 >60 56*  GFRAA >60   < > >60 >60   < > >60 >60 >60  ANIONGAP 10   < > 7 15   < > 14 13 12    < > = values in this interval not displayed.     Hematology Recent Labs  Lab 04/17/18 0228 04/18/18 0245 May 10, 2018 0737  WBC 7.4 8.4 7.5  RBC 3.78* 3.87* 3.60*  HGB 11.7* 12.0* 11.3*  HCT 36.2* 37.4* 34.1*  MCV 95.8 96.6 94.7  MCH 31.0 31.0 31.4  MCHC 32.3 32.1 33.1  RDW 17.2* 17.5* 17.6*  PLT 141* 115* 100*    Cardiac Enzymes Recent Labs  Lab 04/16/18 0837  TROPONINI 0.03*    Recent Labs  Lab 04/05/2018 1144  TROPIPOC 0.04     BNPNo results for input(s): BNP, PROBNP in the last 168 hours.   DDimer No results for input(s): DDIMER in the last 168 hours.   Radiology    Dg Chest  Port 1 View  Result Date: 04/18/2018 CLINICAL DATA:  Post intubation EXAM: PORTABLE CHEST 1 VIEW COMPARISON:  04/16/2018, 04/11/2018, 04/09/2018 FINDINGS: Endotracheal tube tip is about 4.1 cm superior to the carina. Small right-sided pleural effusion. No change in small left pleural effusion with dense airspace disease at the left base. Cardiomegaly. No pneumothorax. Multiple metallic opacities over the chest. IMPRESSION: 1. Endotracheal tube tip about 4.1 cm superior to the carina 2. No significant change in left greater than right pleural effusion, cardiomegaly with vascular congestion, and dense airspace disease at the left base Electronically Signed   By: Donavan Foil M.D.   On: 04/18/2018 16:52   Dg Swallowing Func-speech Pathology  Result Date: 04/17/2018 Objective Swallowing Evaluation: Type of Study: MBS-Modified Barium Swallow Study  Patient Details Name: Derrick Ayala MRN: [id number] Date of Birth: Jan 21, 1935 Today's Date: 04/17/2018 Time: SLP Start Time (ACUTE ONLY): 1206 -SLP Stop Time (ACUTE ONLY): 1229 SLP Time Calculation (min) (ACUTE ONLY): 23 min Past Medical History: Past Medical History: Diagnosis Date . Hypertension  . Hypokalemia  . New onset atrial fibrillation ([place]) 01/18/2018  startd on Eliquis recently . RBBB 01/18/2018 . 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 COLONOSCOPY WITH PROPOFOL N/A 10/19/2017  Procedure: COLONOSCOPY WITH PROPOFOL;  Surgeon: Laurence Spates, MD;  Location: [facility];  Service: Endoscopy;  Laterality: N/A; . ESOPHAGOGASTRODUODENOSCOPY (EGD) WITH PROPOFOL N/A 10/19/2017  Procedure: ESOPHAGOGASTRODUODENOSCOPY (EGD) WITH PROPOFOL;  Surgeon: Laurence Spates, MD;  Location: [facility];  Service: Endoscopy;  Laterality: N/A; . HERNIA REPAIR    12-13 yrs ago HPI: Pt is an 83 yo  male admitted on 04/06/2018 with SOB, found to have postobstructive pneumonia with a possible pulmonary mass. PMH of  former smoker, SCC of L vocal cord S/P XRT (2015), A. fib, HTN. Pt has reported difficulty swallowing to providers in the past per chart review. He was ordered to have MBS in 2015 and again in June 2019, but he did not comt to either appointment.  Subjective: alert but confused, difficulty providing history Assessment / Plan / Recommendation CHL IP CLINICAL IMPRESSIONS 04/17/2018 Clinical Impression Pt has a moderate-severe oropharyngeal and cervical esophageal, with chronic component suspected given h/o laryngeal cancer s/p XRT. His lingual manipulation for bolus cohesion and A/P transfer is limited, resulting in slow bolus movement, decreased cohesion, and lingual residue.  Pharyngeally he has significantly impaired base of tongue retraction, pharyngeal constriction, hyolaryngeal movement, and UES relaxation. Pt therefore has reduced epiglottic deflection, airway protection, and pharyngeal clearance. Pt has consitent penetration with thin liquids that he appears to sense once they hit the true vocal folds, but his spontaneous cough is not effective at clearing the airway and cognitively he does not always cough to command. Aspiration did occur with thin liquids as barium could not be cleared. As consistencies become thicker they offer increased airway protection during the swallow; however, they also result in increasing amounts of residue that is penetrated then after the swallow. After only a few bites of puree, pt's pyriform sinuses and valleculae were filled with barium, which could never be completely cleared despite attempted strategies, use of yankauer/attempts at expectoration, and liquid rinses. Pt is at a high risk for poor nutrition/hydration as well as aspiration. What is unclear is how this compared to his baseline. If there is some acute decompensation then there could be some potential  for recovery; however, if this is near baseline as suspected from [facility], pt will likely benefit from [place] discussion regarding aspiration risk. Would maintain NPO status for now except for few ice chips, administered by RN only after oral care.  SLP Visit Diagnosis Dysphagia, oropharyngeal phase (R13.12);Dysphagia, pharyngoesophageal phase (R13.14) Attention and concentration deficit following -- Frontal lobe and executive function deficit following -- Impact on safety and function Severe aspiration risk;Risk for inadequate nutrition/hydration   CHL IP TREATMENT RECOMMENDATION 04/17/2018 Treatment Recommendations Therapy as outlined in treatment plan below   Prognosis 04/17/2018 Prognosis for Safe Diet Advancement Guarded Barriers to Reach Goals Cognitive deficits;Severity of deficits;Time post onset Barriers/Prognosis Comment -- CHL IP DIET RECOMMENDATION 04/17/2018 SLP Diet Recommendations NPO;Ice chips PRN after oral care Liquid Administration via -- Medication Administration Via alternative means Compensations -- Postural Changes --   CHL IP OTHER RECOMMENDATIONS 04/17/2018 Recommended Consults -- Oral Care Recommendations Oral care QID Other Recommendations Have oral suction available   CHL IP FOLLOW UP RECOMMENDATIONS 04/17/2018 Follow up Recommendations (No Data)   CHL IP FREQUENCY AND DURATION 04/17/2018 Speech Therapy Frequency (ACUTE ONLY) min 2x/week Treatment Duration 2 weeks      CHL IP ORAL PHASE 04/17/2018 Oral Phase Impaired Oral - Pudding Teaspoon -- Oral - Pudding Cup -- Oral - Honey Teaspoon -- Oral - Honey Cup -- Oral - Nectar Teaspoon -- Oral - Nectar Cup Weak lingual manipulation;Reduced posterior propulsion;Lingual/palatal residue;Decreased bolus cohesion;Premature spillage Oral - Nectar Straw -- Oral - Thin Teaspoon Weak lingual manipulation;Reduced posterior propulsion;Lingual/palatal residue;Decreased bolus cohesion;Premature spillage Oral - Thin Cup Weak lingual manipulation;Reduced posterior  propulsion;Lingual/palatal residue;Decreased bolus cohesion;Premature spillage Oral - Thin Straw -- Oral - Puree Weak lingual manipulation;Reduced posterior propulsion;Lingual/palatal residue;Decreased bolus cohesion Oral - Mech Soft -- Oral - Regular -- Oral - Multi-Consistency -- Oral - Pill -- Oral Phase - Comment --  CHL IP PHARYNGEAL PHASE 04/17/2018 Pharyngeal Phase Impaired Pharyngeal- Pudding Teaspoon -- Pharyngeal -- Pharyngeal- Pudding Cup -- Pharyngeal -- Pharyngeal- Honey Teaspoon -- Pharyngeal -- Pharyngeal- Honey Cup -- Pharyngeal -- Pharyngeal- Nectar Teaspoon -- Pharyngeal -- Pharyngeal- Nectar Cup Reduced pharyngeal peristalsis;Reduced epiglottic inversion;Reduced anterior laryngeal mobility;Reduced laryngeal elevation;Reduced airway/laryngeal closure;Reduced tongue base retraction;Pharyngeal residue - valleculae;Pharyngeal residue - pyriform;Penetration/Aspiration during swallow;Penetration/Apiration after swallow Pharyngeal Material enters airway, remains ABOVE vocal cords and not ejected out Pharyngeal- Nectar Straw -- Pharyngeal -- Pharyngeal- Thin Teaspoon Reduced pharyngeal peristalsis;Reduced epiglottic inversion;Reduced anterior laryngeal mobility;Reduced laryngeal elevation;Reduced airway/laryngeal closure;Reduced tongue base retraction;Pharyngeal residue - valleculae;Pharyngeal residue - pyriform;Penetration/Aspiration during swallow Pharyngeal Material enters airway, remains ABOVE vocal cords and not ejected out Pharyngeal- Thin Cup Reduced pharyngeal peristalsis;Reduced epiglottic inversion;Reduced anterior laryngeal mobility;Reduced laryngeal elevation;Reduced airway/laryngeal closure;Reduced tongue base retraction;Pharyngeal residue - valleculae;Pharyngeal residue - pyriform;Penetration/Aspiration during swallow;Penetration/Apiration after swallow Pharyngeal Material enters airway, passes BELOW cords and not ejected out despite cough attempt by patient Pharyngeal- Thin Straw --  Pharyngeal -- Pharyngeal- Puree Reduced pharyngeal peristalsis;Reduced epiglottic inversion;Reduced anterior laryngeal mobility;Reduced laryngeal elevation;Reduced airway/laryngeal closure;Reduced tongue base retraction;Pharyngeal residue - valleculae;Pharyngeal residue - pyriform;Penetration/Apiration after swallow Pharyngeal Material enters airway, remains ABOVE vocal cords and not ejected out Pharyngeal- Mechanical Soft -- Pharyngeal -- Pharyngeal- Regular -- Pharyngeal -- Pharyngeal- Multi-consistency -- Pharyngeal -- Pharyngeal- Pill -- Pharyngeal -- Pharyngeal Comment --  CHL IP CERVICAL ESOPHAGEAL PHASE 04/17/2018 Cervical Esophageal Phase Impaired Pudding Teaspoon -- Pudding Cup -- Honey Teaspoon -- Honey Cup -- Nectar Teaspoon -- Nectar Cup Reduced cricopharyngeal relaxation Nectar Straw -- Thin Teaspoon Reduced cricopharyngeal relaxation Thin Cup Reduced cricopharyngeal relaxation Thin Straw -- Puree Reduced cricopharyngeal relaxation Mechanical Soft -- Regular -- Multi-consistency -- Pill -- Cervical Esophageal Comment -- Germain Osgood 04/17/2018, 2:24 PM  Germain Osgood, M.A. CCC-SLP Acute Rehabilitation Services Pager [phone] Office [phone]              Cardiac Studies   04/16/18 TTE Left ventricle: The cavity size was normal. Systolic function was moderately to severely reduced. The estimated ejection fraction was in the range of 30% to 35%. Diffuse hypokinesis. There is hypokinesis of the anterolateral, lateral, inferolateral, and inferior myocardium. The study is not technically sufficient to allow evaluation of LV diastolic function. - Ventricular septum: Septal motion showed abnormal function and dyssynergy. - Aortic valve: There was trivial regurgitation. - Mitral valve: There was moderate regurgitation. - Left atrium: The atrium was moderately dilated. - Right atrium: The atrium was mildly dilated. - Tricuspid valve: There was moderate  regurgitation. - Pulmonary arteries: Systolic pressure was mildly increased. PA peak pressure: 41 mm Hg (S). - Pericardium, extracardiac: A small pericardial effusion was identified circumferential to the heart.   Patient Profile     83 y.o. male with persistentatrial fibrillationwith RVR, CHF w moderately depressed LVEF, HTN, severe electrolyte imbalances, left lung cavitary mass +/- pneumonia, history of glottic squamous cell Ca/XRT with secondary dysphagia, status post cardiopulmonary arrest yesterday 9/18 2019 (brief asystole 12 seconds, followed by frequent PVCs on a background of bradycardia, degenerating to ventricular tachycardia/ventricular fibrillation).  Assessment & Plan    1. AFib: Remarkably slow ventricular response in the absence of any active rate control medications (probably still has a lot of amiodarone in the system).  Holding beta-blockers due to hypotension and bradycardia. 2. CHF:No obvious hypervolemia physical exam, but exam limited in the setting of positive pressure ventilation.  Today afternoon's chest x-ray suggested vascular congestion.  Should not receive diuretics unless his hypokalemia is corrected  3. Long QT: Need to correct his hypokalemia 4. Hypokalemia/hypomagnesemia: improving, not yet fully repleted. 5. Suspected lung Ca    Prognosis is dismal.  Palliative care is appropriately involved.  Patient has a "partial code" status.      For questions or updates, please contact [place] Please consult [email or web address] for contact info under        Signed, Sanda Klein, MD  2018-04-30, 9:28 AM

## 2018-05-01 NOTE — Progress Notes (Signed)
RT note-Ventilator rate changed post ABG per CCM

## 2018-05-01 NOTE — Progress Notes (Signed)
Daily Progress Note   Patient Name: Derrick Ayala       Date: 2018-05-14 DOB: [birth year]  Age: 83 y.o. MRN#: [id number] Attending Physician: Rush Farmer, MD Primary Care Physician: Lujean Amel, MD Admit Date: 04/18/2018  Reason for Consultation/Follow-up: Establishing goals of care  Subjective:  Patient seen earlier this am, resting in bed Patient opens his eyes when his name is called, how ever, he rolls his eyes upwards, appears restless.   There is no family at bedside, call placed and discussed with daughter Derrick Ayala.   See below:   Length of Stay: 7  Current Medications: Scheduled Meds:  . albuterol  2.5 mg Nebulization Q6H  . chlorhexidine  15 mL Mouth Rinse BID  . Chlorhexidine Gluconate Cloth  6 each Topical Daily  . mouth rinse  15 mL Mouth Rinse q12n4p  . potassium chloride  40 mEq Per Tube Q4H  . sodium chloride  2 spray Each Nare Daily  . sodium chloride flush  10-40 mL Intracatheter Q12H    Continuous Infusions: . sodium chloride 1,000 mL (04/18/18 1701)  . DOPamine 3 mcg/kg/min (05/14/18 0945)  . epinephrine    . famotidine (PEPCID) IV    . fentaNYL infusion INTRAVENOUS 250 mcg/hr (2018-05-14 1028)  . piperacillin-tazobactam (ZOSYN)  IV 12.5 mL/hr at May 14, 2018 0800  . vancomycin      PRN Meds: sodium chloride, fentaNYL (SUBLIMAZE) injection, sodium chloride flush  Physical Exam         Elderly gentleman Currently intubated, sedated, mechanically ventilated Irregular Shallow regular breath sounds Abdomen is not distended Has edema Upward gaze and some decorticate posturing when his name is called  Vital Signs: BP 110/80   Pulse (!) 48   Temp 97.8 F (36.6 C) (Oral)   Resp 10   Ht 5\' 11"  (1.803 m)   Wt 76.4 kg   SpO2 100%   BMI 23.49 kg/m    SpO2: SpO2: 100 % O2 Device: O2 Device: Ventilator O2 Flow Rate: O2 Flow Rate (L/min): 2.5 L/min  Intake/output summary:   Intake/Output Summary (Last 24 hours) at 2018-05-14 1148 Last data filed at May 14, 2018 0800 Gross per 24 hour  Intake 1432.95 ml  Output 100 ml  Net 1332.95 ml   LBM: Last BM Date: 04/18/18 Baseline Weight: Weight: 63.5 kg Most recent weight: Weight: 76.4  kg       Palliative Assessment/Data:      Patient Active Problem List   Diagnosis Date Noted  . Septic shock ([place])   . Protein-calorie malnutrition, severe 04/18/2018  . Cardiac arrest, cause unspecified ([place])   . Acute respiratory failure with hypoxia ([place])   . Anoxic brain injury ([place])   . Cardiogenic shock ([place])   . SOB (shortness of breath) 04/10/2018  . Postobstructive pneumonia 04/08/2018  . Hyperglycemia 04/11/2018  . New onset atrial fibrillation ([place]) 01/18/2018  . RBBB 01/18/2018  . Essential hypertension 11/01/2013  . Hypokalemia 10/22/2013  . Glottis carcinoma ([place]) 09/04/2013    Palliative Care Assessment & Plan   Patient Profile:    Assessment:  Acute hypoxemic respiratory require mechanical ventilation -Cardiogenic shock/septic shock -V. tach V. fib arrest -Aspiration pneumonia -Suspected anoxic brain injury -Lung mass concerning for malignancy ongoing functional decline, prior to this hospitalization Dysphagia in this hospitalization prior to code event.   Recommendations/Plan:  Call placed and discussed with daughter, she is aware that the patient is to undergo neurological evaluation today, the patient is awaiting CT head and EEG today.   I did discuss with the patient's daughter about our concerns regarding the possibility of anoxic brain injury.   Wife, according to the daughter, is still in rehab, after a stroke, she is reportedly being d/c home in a few days. Daughter states that wife has been informed that the patient is in the hospital.   Ongoing GOC  discussions with daughter about the serious nature of the patient's illness. PMT will continue to follow.      Code Status:    Code Status Orders  (From admission, onward)         Start     Ordered   04/18/18 1556  Limited resuscitation (code)  Continuous    Question Answer Comment  In the event of cardiac or respiratory ARREST: Initiate Code Blue, Call Rapid Response Yes   In the event of cardiac or respiratory ARREST: Perform CPR No   In the event of cardiac or respiratory ARREST: Perform Intubation/Mechanical Ventilation Yes   In the event of cardiac or respiratory ARREST: Use NIPPV/BiPAp only if indicated Yes   In the event of cardiac or respiratory ARREST: Administer ACLS medications if indicated Yes   In the event of cardiac or respiratory ARREST: Perform Defibrillation or Cardioversion if indicated No      04/18/18 1555        Code Status History    Date Active Date Inactive Code Status Order ID Comments User Context   04/26/2018 1550 04/18/2018 1555 Full Code [id number]  Karmen Bongo, MD ED   11/11/2013 1228 11/13/2013 1932 Full Code [id number]  [name], MD Inpatient   11/01/2013 1426 11/02/2013 1421 Full Code [id number]  Velvet Bathe, MD Inpatient    Advance Directive Documentation     Most Recent Value  Type of Advance Directive  Living will  Pre-existing out of facility DNR order (yellow form or pink MOST form)  -  "MOST" Form in Place?  -       Prognosis:   guarded   Discharge Planning:  To Be Determined  Care plan was discussed with  Patient's daughter Derrick Ayala over the phone, also discussed with PCCM colleague Marni Griffon.   Thank you for allowing the Palliative Medicine Team to assist in the care of this patient.   Time In: 9 Time Out: 9.35 Total Time 35 Prolonged Time Billed  no       Greater than 50%  of this time was spent counseling and coordinating care related to the above assessment and plan.  Loistine Chance, MD [phone]  Please  contact Palliative Medicine Team phone at [phone] for questions and concerns.

## 2018-05-01 NOTE — Progress Notes (Signed)
Called to respond to pg, pt death.  Had met pt's daughter, [name], the day before when her father had coded.  Was present w/ her, prayer w/ her and her deceased father to God to rcv into God's care.    Also received funeral home information from daughter Kalman Shan Hart ([place]) [facility]) and gave to care RN.  Myra Gianotti resident, [phone]

## 2018-05-01 DEATH — deceased

## 2018-05-07 ENCOUNTER — Telehealth: Payer: Self-pay | Admitting: Pulmonary Disease

## 2018-05-07 NOTE — Telephone Encounter (Signed)
Received DC 05/07/18 from [facility] for Dr. Halford Chessman at [facility] for [place].

## 2018-05-08 ENCOUNTER — Telehealth: Payer: Self-pay | Admitting: Pulmonary Disease

## 2018-05-08 NOTE — Telephone Encounter (Signed)
Received signed DC Faxed to [facility] [phone] and Mailed to [facility] Dept.

## 2018-06-01 NOTE — Death Summary Note (Signed)
Derrick Ayala was a 83 y.o. male former smoker admitted on 04/18/2018 with progressive shortness of breath, cough and sputum.  He was found to have mass like consolidation on his CT chest with post obstructive pneumonia.  He was started on antibiotics.  He developed agitated delirium on 04/16/18.  He was treated with ativan.  Echo showed acute systolic CHF and cardiology consulted.  He had trouble with swallowing and assessed by speech therapy.  Was seen by palliative care.  On 04/18/18 developed VT/VF with cardiac arrest.  Had ROSC after 20 minutes.  Intubated. Made DNR but continue medical care.  He developed asystole on May 19, 2018 and pronounced dead at 11-25-26.   Final diagnoses: Ventricular tachycardia/fibrillation with cardiac arrest Acute hypoxic respiratory failure Cardiogenic shock Septic shock Acute systolic CHF Chronic diastolic CHF Post obstructive pneumonia History of left vocal cord squamous cell carcinoma Chronic atrial fibrillation Prolonged QT RBBB Hypokalemia Hypomagnesemia History of hypertension BPH Moderate protein calorie malnutrition Anemia of critical illness and chronic disease Dysphagia Acute metabolic encephalopathy  Acute anoxic encephalopathy  Chesley Mires, MD [place] 05/22/2018, 1:22 PM

## 2018-07-20 ENCOUNTER — Ambulatory Visit: Payer: Medicare Other

## 2018-07-20 ENCOUNTER — Ambulatory Visit: Payer: Self-pay | Admitting: Radiation Oncology

## 2019-05-27 ENCOUNTER — Encounter (INDEPENDENT_AMBULATORY_CARE_PROVIDER_SITE_OTHER): Payer: Self-pay

## 2020-09-08 IMAGING — DX DG CHEST 1V PORT
1 series · 1 of 1 positions shown · non-contrast
Comparison: 04/16/2018, 04/11/2018, 04/09/2018

CLINICAL DATA: Post intubation

EXAM:
PORTABLE CHEST 1 VIEW

[chest]
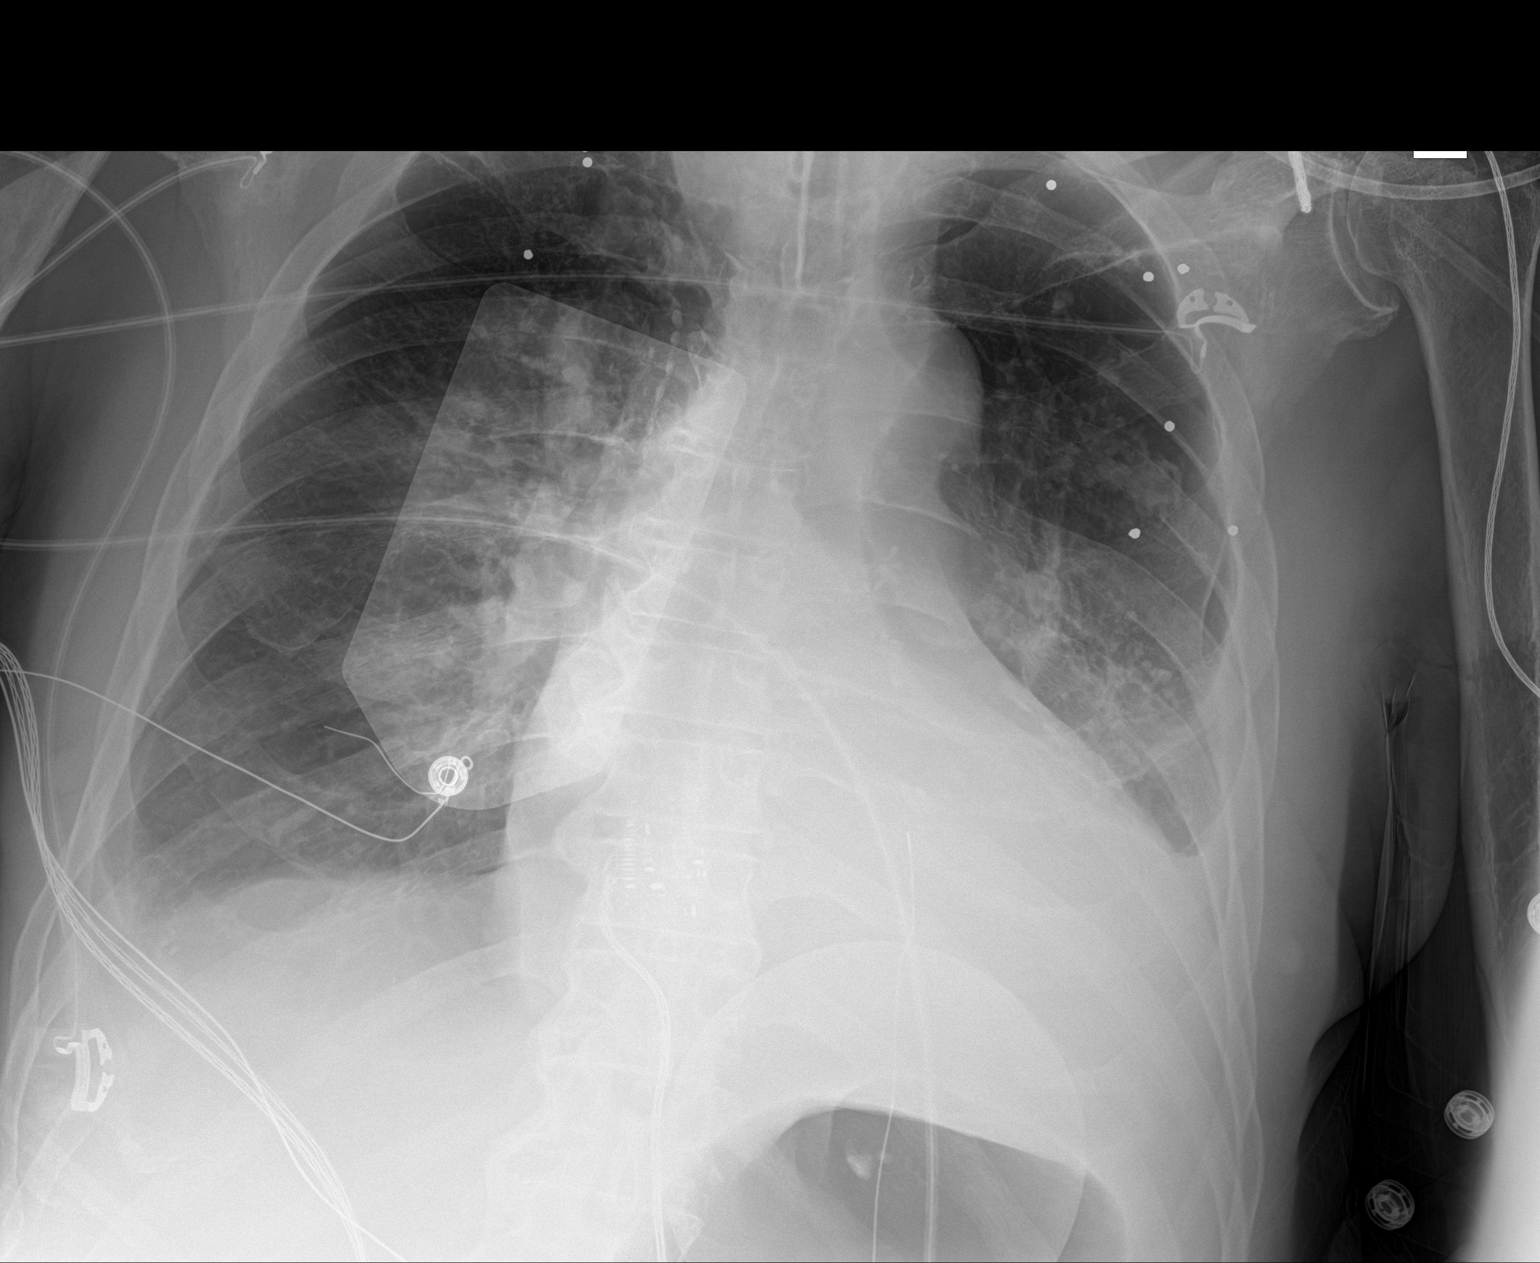

[1 of 1 positions shown; findings below may reference images not displayed]

FINDINGS: Endotracheal tube tip is about 4.1 cm superior to the carina. Small
right-sided pleural effusion. No change in small left pleural
effusion with dense airspace disease at the left base. Cardiomegaly.
No pneumothorax. Multiple metallic opacities over the chest.
IMPRESSION: 1. Endotracheal tube tip about 4.1 cm superior to the carina
2. No significant change in left greater than right pleural
effusion, cardiomegaly with vascular congestion, and dense airspace
disease at the left base
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
